# Patient Record
Sex: Female | Born: 1965 | Race: White | Hispanic: No | Smoking: Current every day smoker
Health system: Southern US, Community
[De-identification: ages and names within clinical notes are randomized; demographics above are authoritative.]

## PROBLEM LIST (undated history)

## (undated) DIAGNOSIS — Z9889 Other specified postprocedural states: Secondary | ICD-10-CM

## (undated) DIAGNOSIS — F4323 Adjustment disorder with mixed anxiety and depressed mood: Secondary | ICD-10-CM

## (undated) DIAGNOSIS — Z720 Tobacco use: Secondary | ICD-10-CM

## (undated) DIAGNOSIS — J40 Bronchitis, not specified as acute or chronic: Secondary | ICD-10-CM

## (undated) DIAGNOSIS — M5481 Occipital neuralgia: Secondary | ICD-10-CM

## (undated) DIAGNOSIS — R091 Pleurisy: Secondary | ICD-10-CM

## (undated) DIAGNOSIS — R112 Nausea with vomiting, unspecified: Secondary | ICD-10-CM

## (undated) DIAGNOSIS — M542 Cervicalgia: Secondary | ICD-10-CM

## (undated) DIAGNOSIS — J120 Adenoviral pneumonia: Secondary | ICD-10-CM

## (undated) DIAGNOSIS — R51 Headache: Secondary | ICD-10-CM

## (undated) HISTORY — DX: Tobacco use: Z72.0

## (undated) HISTORY — DX: Cervicalgia: M54.2

## (undated) HISTORY — PX: GANGLION CYST EXCISION: SHX1691

## (undated) HISTORY — DX: Adenoviral pneumonia: J12.0

## (undated) HISTORY — DX: Headache: R51

## (undated) HISTORY — PX: FRACTURE SURGERY: SHX138

## (undated) HISTORY — DX: Bronchitis, not specified as acute or chronic: J40

## (undated) HISTORY — DX: Pleurisy: R09.1

## (undated) HISTORY — PX: HEMORRHOID SURGERY: SHX153

---

## 2001-04-29 ENCOUNTER — Other Ambulatory Visit: Admission: RE | Admit: 2001-04-29 | Discharge: 2001-04-29 | Payer: Self-pay | Admitting: Obstetrics and Gynecology

## 2001-06-23 ENCOUNTER — Ambulatory Visit (HOSPITAL_COMMUNITY): Admission: RE | Admit: 2001-06-23 | Discharge: 2001-06-23 | Payer: Self-pay | Admitting: Obstetrics and Gynecology

## 2001-06-23 ENCOUNTER — Encounter: Payer: Self-pay | Admitting: Obstetrics and Gynecology

## 2002-07-18 ENCOUNTER — Ambulatory Visit (HOSPITAL_COMMUNITY): Admission: RE | Admit: 2002-07-18 | Discharge: 2002-07-18 | Payer: Self-pay | Admitting: Obstetrics and Gynecology

## 2002-07-18 ENCOUNTER — Encounter: Payer: Self-pay | Admitting: Obstetrics and Gynecology

## 2002-07-25 ENCOUNTER — Ambulatory Visit (HOSPITAL_COMMUNITY): Admission: RE | Admit: 2002-07-25 | Discharge: 2002-07-25 | Payer: Self-pay | Admitting: Obstetrics and Gynecology

## 2002-07-25 ENCOUNTER — Encounter: Payer: Self-pay | Admitting: Obstetrics and Gynecology

## 2002-10-11 ENCOUNTER — Encounter: Payer: Self-pay | Admitting: Family Medicine

## 2002-10-11 ENCOUNTER — Ambulatory Visit (HOSPITAL_COMMUNITY): Admission: RE | Admit: 2002-10-11 | Discharge: 2002-10-11 | Payer: Self-pay | Admitting: Family Medicine

## 2002-10-12 ENCOUNTER — Encounter: Payer: Self-pay | Admitting: Family Medicine

## 2002-10-12 ENCOUNTER — Ambulatory Visit (HOSPITAL_COMMUNITY): Admission: RE | Admit: 2002-10-12 | Discharge: 2002-10-12 | Payer: Self-pay | Admitting: Family Medicine

## 2003-08-11 ENCOUNTER — Ambulatory Visit (HOSPITAL_COMMUNITY): Admission: RE | Admit: 2003-08-11 | Discharge: 2003-08-11 | Payer: Self-pay | Admitting: General Surgery

## 2003-12-28 ENCOUNTER — Ambulatory Visit (HOSPITAL_COMMUNITY): Admission: RE | Admit: 2003-12-28 | Discharge: 2003-12-28 | Payer: Self-pay | Admitting: Obstetrics and Gynecology

## 2005-01-17 ENCOUNTER — Ambulatory Visit (HOSPITAL_COMMUNITY): Admission: RE | Admit: 2005-01-17 | Discharge: 2005-01-17 | Payer: Self-pay | Admitting: Obstetrics and Gynecology

## 2006-02-06 ENCOUNTER — Ambulatory Visit (HOSPITAL_COMMUNITY): Admission: RE | Admit: 2006-02-06 | Discharge: 2006-02-06 | Payer: Self-pay | Admitting: *Deleted

## 2006-07-17 LAB — CONVERTED CEMR LAB: Pap Smear: NORMAL

## 2006-09-18 ENCOUNTER — Encounter (INDEPENDENT_AMBULATORY_CARE_PROVIDER_SITE_OTHER): Payer: Self-pay | Admitting: *Deleted

## 2006-09-18 ENCOUNTER — Ambulatory Visit (HOSPITAL_COMMUNITY): Admission: RE | Admit: 2006-09-18 | Discharge: 2006-09-18 | Payer: Self-pay | Admitting: General Surgery

## 2007-02-08 ENCOUNTER — Ambulatory Visit (HOSPITAL_COMMUNITY): Admission: RE | Admit: 2007-02-08 | Discharge: 2007-02-08 | Payer: Self-pay | Admitting: Nurse Practitioner

## 2007-04-28 LAB — CONVERTED CEMR LAB
ALT: 16 units/L
Basophils Absolute: 0.1 10*3/uL
Calcium: 9.4 mg/dL
Creatinine, Ser: 0.76 mg/dL
Eosinophils Absolute: 0.3 10*3/uL
HCT: 42.5 %
Lymphocytes Relative: 27 %
Lymphs Abs: 2.4 10*3/uL
MCV: 93.5 fL
Monocytes Relative: 7 %
Neutro Abs: 5.6 10*3/uL
Neutrophils Relative %: 62 %
RBC: 4.55 M/uL
RDW: 13.5 %

## 2007-04-30 ENCOUNTER — Ambulatory Visit (HOSPITAL_COMMUNITY): Admission: RE | Admit: 2007-04-30 | Discharge: 2007-04-30 | Payer: Self-pay | Admitting: Obstetrics & Gynecology

## 2007-04-30 ENCOUNTER — Encounter: Payer: Self-pay | Admitting: Obstetrics & Gynecology

## 2007-07-14 ENCOUNTER — Ambulatory Visit: Payer: Self-pay | Admitting: Internal Medicine

## 2007-07-14 DIAGNOSIS — K069 Disorder of gingiva and edentulous alveolar ridge, unspecified: Secondary | ICD-10-CM

## 2007-07-14 DIAGNOSIS — F17200 Nicotine dependence, unspecified, uncomplicated: Secondary | ICD-10-CM

## 2007-07-14 DIAGNOSIS — K056 Periodontal disease, unspecified: Secondary | ICD-10-CM | POA: Insufficient documentation

## 2007-07-15 ENCOUNTER — Encounter (INDEPENDENT_AMBULATORY_CARE_PROVIDER_SITE_OTHER): Payer: Self-pay | Admitting: Internal Medicine

## 2007-07-15 ENCOUNTER — Telehealth (INDEPENDENT_AMBULATORY_CARE_PROVIDER_SITE_OTHER): Payer: Self-pay | Admitting: *Deleted

## 2007-07-15 LAB — CONVERTED CEMR LAB
Cholesterol: 212 mg/dL — ABNORMAL HIGH (ref 0–200)
HDL: 49 mg/dL (ref >39)
Triglycerides: 128 mg/dL (ref <150)
VLDL: 26 mg/dL (ref 0–40)

## 2007-08-13 ENCOUNTER — Other Ambulatory Visit: Admission: RE | Admit: 2007-08-13 | Discharge: 2007-08-13 | Payer: Self-pay | Admitting: Obstetrics & Gynecology

## 2007-09-26 ENCOUNTER — Encounter: Payer: Self-pay | Admitting: Emergency Medicine

## 2007-09-26 ENCOUNTER — Inpatient Hospital Stay (HOSPITAL_COMMUNITY): Admission: AD | Admit: 2007-09-26 | Discharge: 2007-09-26 | Payer: Self-pay | Admitting: Family Medicine

## 2007-09-26 ENCOUNTER — Ambulatory Visit: Payer: Self-pay | Admitting: Family Medicine

## 2007-10-15 ENCOUNTER — Ambulatory Visit: Payer: Self-pay | Admitting: Internal Medicine

## 2007-10-15 DIAGNOSIS — J069 Acute upper respiratory infection, unspecified: Secondary | ICD-10-CM | POA: Insufficient documentation

## 2008-02-11 ENCOUNTER — Ambulatory Visit (HOSPITAL_COMMUNITY): Admission: RE | Admit: 2008-02-11 | Discharge: 2008-02-11 | Payer: Self-pay | Admitting: Obstetrics & Gynecology

## 2008-02-28 ENCOUNTER — Ambulatory Visit: Payer: Self-pay | Admitting: Family Medicine

## 2008-02-28 ENCOUNTER — Inpatient Hospital Stay (HOSPITAL_COMMUNITY): Admission: AD | Admit: 2008-02-28 | Discharge: 2008-02-29 | Payer: Self-pay | Admitting: Gynecology

## 2008-08-14 ENCOUNTER — Other Ambulatory Visit: Admission: RE | Admit: 2008-08-14 | Discharge: 2008-08-14 | Payer: Self-pay | Admitting: Obstetrics & Gynecology

## 2008-11-22 ENCOUNTER — Ambulatory Visit: Payer: Self-pay | Admitting: Internal Medicine

## 2008-11-27 ENCOUNTER — Telehealth (INDEPENDENT_AMBULATORY_CARE_PROVIDER_SITE_OTHER): Payer: Self-pay | Admitting: Internal Medicine

## 2009-02-13 ENCOUNTER — Ambulatory Visit (HOSPITAL_COMMUNITY): Admission: RE | Admit: 2009-02-13 | Discharge: 2009-02-13 | Payer: Self-pay | Admitting: Obstetrics & Gynecology

## 2009-08-30 ENCOUNTER — Other Ambulatory Visit: Admission: RE | Admit: 2009-08-30 | Discharge: 2009-08-30 | Payer: Self-pay | Admitting: Obstetrics and Gynecology

## 2009-12-16 ENCOUNTER — Emergency Department (HOSPITAL_COMMUNITY): Admission: EM | Admit: 2009-12-16 | Discharge: 2009-12-16 | Payer: Self-pay | Admitting: Emergency Medicine

## 2009-12-16 ENCOUNTER — Ambulatory Visit (HOSPITAL_COMMUNITY): Admission: EM | Admit: 2009-12-16 | Discharge: 2009-12-16 | Payer: Self-pay | Admitting: Emergency Medicine

## 2010-03-21 ENCOUNTER — Ambulatory Visit (HOSPITAL_COMMUNITY): Admission: RE | Admit: 2010-03-21 | Discharge: 2010-03-21 | Payer: Self-pay | Admitting: Obstetrics & Gynecology

## 2010-10-29 NOTE — Op Note (Signed)
[name], [name]                 ACCOUNT NO.:  [id number]   MEDICAL RECORD NO.:  [id number]          PATIENT TYPE:  AMB   LOCATION:  DAY                           FACILITY:  [facility]   PHYSICIAN:  Lazaro Arms, M.D.   DATE OF BIRTH:  [birth year]   DATE OF PROCEDURE:  04/30/2007  DATE OF DISCHARGE:                               OPERATIVE REPORT   PREOPERATIVE DIAGNOSES:  1. Menometrorrhagia.  2. Dysmenorrhea.   POSTOPERATIVE DIAGNOSES:  1. Menometrorrhagia.  2. Dysmenorrhea.   PROCEDURE:  Hysteroscopy, D&C, endometrial ablation.   SURGEON:  Dr. Despina Hidden   ANESTHESIA:  General endotracheal.   FINDINGS:  The patient had normal endometrial cavity, no polyps,  fibroids, or other abnormalities.   DESCRIPTION OF OPERATION:  The patient was taken to the operating room,  placed in a supine position, where she underwent general endotracheal  anesthesia.  She was then placed in dorsal lithotomy position, prepped  and draped in the usual sterile fashion.  A Grave speculum was placed.  Her cervix was grasped.  The cervix was dilated serially to allow  passage of the hysteroscope.  A diagnostic hysteroscopy was performed  and found to be completely normal.  A vigorous uterine curettage was  then performed.  Good uterine cry was obtained in all areas.  A  ThermaChoice 3 endometrial ablation balloon was used, and a total  therapy time of 10 minutes and 3 seconds to a temperature of 88 degrees  Celsius that required 11 mL of D5W.  All the fluid was returned at the  end of the procedure.  The patient tolerated the procedure well.  She  experienced minimal blood loss, taken to the recovery room in good  stable condition.  All counts correct.      Lazaro Arms, M.D.  Electronically Signed     LHE/MEDQ  D:  04/30/2007  T:  05/01/2007  Job:  [id number]

## 2010-11-01 NOTE — Op Note (Signed)
[name], [name]                 ACCOUNT NO.:  [id number]   MEDICAL RECORD NO.:  [id number]          PATIENT TYPE:  AMB   LOCATION:  DAY                           FACILITY:  [facility]   PHYSICIAN:  Dalia Heading, M.D.  DATE OF BIRTH:  08-08-1965   DATE OF PROCEDURE:  09/18/2006  DATE OF DISCHARGE:                               OPERATIVE REPORT   PREOPERATIVE DIAGNOSIS:  Recurrent ganglion cyst, left wrist.   POSTOPERATIVE DIAGNOSIS:  Recurrent ganglion cyst, left wrist.   PROCEDURE:  Excision of recurrent ganglion cyst, left wrist.   SURGEON:  Franky Macho, MD   ANESTHESIA:  Regional.   INDICATIONS:  The patient is a 45 year old of white female, status post  excision of a ganglion cyst in the past, who now presents with a  recurrence of the ganglion cyst in the left wrist.  The risks and  benefits of the procedure including bleeding, infection, and recurrence  of the cyst were fully explained to the patient, who gave informed  consent.   PROCEDURE NOTE:  The patient was placed in the supine position.  After a  regional block was applied, the left wrist was prepped and draped using  the usual sterile technique with Betadine.  Surgical site confirmation  was performed.   The ganglion cyst was along the volar aspect of the wrist, just over the  radial artery.  An incision was made longitudinally along the previous  surgical scar.  The cyst was excised without difficulty.  The base of  the cyst was ligated using a 5-0 nylon suture.  The skin was  reapproximated using a 4-0 nylon vertical mattress suture.  Betadine  ointment and dry sterile dressing were applied.   All tape and needle counts were correct at the end of the procedure.  The tourniquet was let down at approximately 22 minutes.  The patient  was transferred to PACU in stable condition.   COMPLICATIONS:  None.   SPECIMEN:  Ganglion cyst, left wrist.   BLOOD LOSS:  Minimal.      Dalia Heading, M.D.  Electronically Signed     MAJ/MEDQ  D:  09/18/2006  T:  09/19/2006  Job:  [id number]   cc:   [facility], [place], [place] Ninfa Linden FNP

## 2010-11-01 NOTE — Op Note (Signed)
NAMESHAMIR, SEDLAR                           ACCOUNT NO.:  [id number]   MEDICAL RECORD NO.:  [id number]                   PATIENT TYPE:  AMB   LOCATION:  DAY                                  FACILITY:  [facility]   PHYSICIAN:  Dalia Heading, M.D.               DATE OF BIRTH:  05-15-1966   DATE OF PROCEDURE:  08/11/2003  DATE OF DISCHARGE:                                 OPERATIVE REPORT   PREOPERATIVE DIAGNOSIS:  Hemorrhoidal disease.   POSTOPERATIVE DIAGNOSIS:  Hemorrhoidal disease.   PROCEDURE:  Simple hemorrhoidectomy.   SURGEON:  Dalia Heading, M.D.   ANESTHESIA:  General.   INDICATIONS:  The patient is a 45 year old white female who presents for  evaluation and treatment of hemorrhoidal disease.  She has protrusion along  the posterior aspect of the anus.  The risks and benefits of the procedure  including bleeding, infection, and recurrence of the hemorrhoidal disease  were fully explained to the patient, who gave informed consent   DESCRIPTION OF PROCEDURE:  The patient was placed in the lithotomy position  after general anesthesia was administered.  The perineum was prepped and  draped using the usual sterile technique with Betadine.  Surgical site  confirmation was performed.   On rectal examination, an internal hemorrhoid and external hemorrhoid were  noted at the 7 o'clock position.  There was also an external tag at the 5  o'clock position.  Both hemorrhoids were excised without difficulty.  The  mucosal edges were reapproximated using a 2-0 Vicryl running suture.  Any  bleeding was controlled using Bovie electrocautery.  Sensorcaine 0.5% was  instilled into the surrounding perineum.  No other significant disease was  noted.  Surgicel and viscus Xylocaine packing was then placed into the  rectum.   All tape and needle counts were correct at the end of the procedure.  The  patient was awakened and transferred to PACU in stable condition.   COMPLICATIONS:   None.   SPECIMEN:  Hemorrhoidal tissue.   BLOOD LOSS:  Minimal.      ___________________________________________                                            Dalia Heading, M.D.   MAJ/MEDQ  D:  08/11/2003  T:  08/11/2003  Job:  [id number]   cc:   Dalia Heading, M.D.  [address] [place]  [place] [address]  Fax: [phone]   W. Simone Curia, M.D.  [address]  [place]  [place] [address]  Fax: [phone]

## 2010-11-01 NOTE — H&P (Signed)
NAME:  [name], [name]                           ACCOUNT NO.:  [id number]   MEDICAL RECORD NO.:  [id number]                   PATIENT TYPE:  AMB   LOCATION:  DAY                                  FACILITY:  [facility]   PHYSICIAN:  Dalia Heading, M.D.               DATE OF BIRTH:  [birth year]   DATE OF ADMISSION:  DATE OF DISCHARGE:                                HISTORY & PHYSICAL   CHIEF COMPLAINT:  Hemorrhoidal disease.   HISTORY OF PRESENT ILLNESS:  The patient is a 45 year old white female who  is referred for evaluation and treatment of hemorrhoidal disease.  She has  had problems with painful protruding hemorrhoids for many years.  Her last  episode was last week, treated with topical ointments, and she would like to  have a hemorrhoidectomy.   PAST MEDICAL HISTORY:  Unremarkable.   PAST SURGICAL HISTORY:  Unremarkable.   CURRENT MEDICATIONS:  None.   ALLERGIES:  No known drug allergies.   REVIEW OF SYSTEMS:  The patient smokes a pack of cigarettes a day.  She  denies alcohol use.  She denies any other cardiopulmonary difficulties or  bleeding disorders.   PHYSICAL EXAMINATION:  GENERAL:  The patient is a well-developed, well-  nourished white female in no acute distress.  VITAL SIGNS:  She is afebrile and vital signs are stable.  LUNGS:  Clear to auscultation with equal breath sounds bilaterally.  HEART:  Reveals a regular rate and rhythm without S3, S4, or murmurs.  RECTAL:  Reveals irritated hemorrhoids at the 4 and 7 o'clock positions.  Examination is limited secondary to discomfort.   IMPRESSION:  Hemorrhoidal disease.   PLAN:  The patient is scheduled for a hemorrhoidectomy on August 11, 2003.  The risks and benefits of the procedure including bleeding, infection, and  recurrence of the hemorrhoids was fully explained to the patient, who gave  informed consent.     ___________________________________________                                         Dalia Heading, M.D.   MAJ/MEDQ  D:  08/03/2003  T:  08/03/2003  Job:  [id number]   cc:   Donna Bernard, M.D.  [address]  [place]  [place] [address]  Fax: [phone]

## 2010-11-01 NOTE — H&P (Signed)
NAMEKHRYSTAL, [name]                 ACCOUNT NO.:  [id number]   MEDICAL RECORD NO.:  [id number]          PATIENT TYPE:  AMB   LOCATION:  DAY                           FACILITY:  [facility]   PHYSICIAN:  Dalia Heading, M.D.  DATE OF BIRTH:  [birth year]   DATE OF ADMISSION:  DATE OF DISCHARGE:  LH                              HISTORY & PHYSICAL   CHIEF COMPLAINT:  Recurrent ganglion cyst of the left wrist.   HISTORY OF PRESENT ILLNESS:  This patient is a 45 year old, white female  who is referred for evaluation and treatment of a recurrent left wrist  ganglion cyst.  She had had surgical excision by another surgeon but it  returned soon after the surgery.   PAST MEDICAL HISTORY:  Unremarkable.   PAST SURGICAL HISTORY:  As noted above, hemorrhoidectomy.   CURRENT MEDICATIONS:  None.   ALLERGIES:  No known drug allergies.   REVIEW OF SYSTEMS:  The patient does not drink or smoke.  She denies any  cardiopulmonary difficulties or bleeding disorders.   PHYSICAL EXAMINATION:  GENERAL:  The patient is a well-developed, well-  nourished, white female in no acute distress.  LUNGS:  Clear to auscultation with equal breath sounds bilaterally.  HEART:  Regular rate and rhythm without S3, S4, murmurs.  LEFT WRIST:  Ganglion cyst over the radial artery on the left.  The  ulnar artery is palpable.   IMPRESSION:  Recurrent ganglion cyst, left wrist.   PLAN:  The patient is scheduled for a recurrent excision of the ganglion  cyst of the left wrist on September 18, 2006.  The risks and benefits of the  procedure including bleeding, infection, and the possibility of  recurrence of this cyst were fully explained to the patient, gave  informed consent.      Dalia Heading, M.D.  Electronically Signed     MAJ/MEDQ  D:  09/17/2006  T:  09/17/2006  Job:  [id number]   cc:   Ninfa Linden, FNP  [facility]  [address]  [place], [place] [address]

## 2011-02-11 ENCOUNTER — Other Ambulatory Visit (HOSPITAL_COMMUNITY): Payer: Self-pay | Admitting: Nurse Practitioner

## 2011-02-11 DIAGNOSIS — Z139 Encounter for screening, unspecified: Secondary | ICD-10-CM

## 2011-03-11 LAB — BASIC METABOLIC PANEL
CO2: 27
Calcium: 8.8
Creatinine, Ser: 0.67
Potassium: 3.6

## 2011-03-11 LAB — CBC
HCT: 36.4
Hemoglobin: 13
MCHC: 35.7
MCV: 95.4
WBC: 13.4 — ABNORMAL HIGH

## 2011-03-11 LAB — DIFFERENTIAL
Basophils Absolute: 0
Basophils Relative: 0
Eosinophils Absolute: 0.2
Lymphocytes Relative: 21
Monocytes Absolute: 1.3 — ABNORMAL HIGH
Monocytes Relative: 10
Neutro Abs: 9.1 — ABNORMAL HIGH

## 2011-03-11 LAB — WET PREP, GENITAL: Yeast Wet Prep HPF POC: NONE SEEN

## 2011-03-17 LAB — CBC
MCV: 96.7
Platelets: 244
WBC: 10.3

## 2011-03-17 LAB — DIFFERENTIAL
Basophils Absolute: 0
Eosinophils Absolute: 0.3
Eosinophils Relative: 3
Lymphocytes Relative: 21
Lymphs Abs: 2.2
Monocytes Absolute: 0.7
Monocytes Relative: 7
Neutrophils Relative %: 69

## 2011-03-19 LAB — COMPREHENSIVE METABOLIC PANEL
ALT: 11
AST: 30
Albumin: 3.6
Alkaline Phosphatase: 69
Chloride: 108
Creatinine, Ser: 0.6
GFR calc Af Amer: >60
Potassium: 5.8 — ABNORMAL HIGH
Sodium: 134 — ABNORMAL LOW
Total Bilirubin: 1.4 — ABNORMAL HIGH

## 2011-03-19 LAB — DIFFERENTIAL
Basophils Absolute: 0
Lymphocytes Relative: 8 — ABNORMAL LOW
Lymphs Abs: 1.3
Neutro Abs: 14.8 — ABNORMAL HIGH
Neutrophils Relative %: 90 — ABNORMAL HIGH

## 2011-03-19 LAB — CBC
Platelets: 259
RDW: 13.6
WBC: 16.5 — ABNORMAL HIGH

## 2011-03-19 LAB — BASIC METABOLIC PANEL
BUN: 7
Calcium: 8.9
GFR calc non Af Amer: >60
Glucose, Bld: 99

## 2011-03-19 LAB — URINALYSIS, ROUTINE W REFLEX MICROSCOPIC
Glucose, UA: NEGATIVE
Ketones, ur: 15 — AB
Leukocytes, UA: NEGATIVE
Nitrite: NEGATIVE
Protein, ur: NEGATIVE
Specific Gravity, Urine: 1.02
Urobilinogen, UA: 0.2

## 2011-03-19 LAB — GC/CHLAMYDIA PROBE AMP, GENITAL
Chlamydia, DNA Probe: NEGATIVE
GC Probe Amp, Genital: NEGATIVE

## 2011-03-19 LAB — WET PREP, GENITAL
Trich, Wet Prep: NONE SEEN
Yeast Wet Prep HPF POC: NONE SEEN

## 2011-03-24 ENCOUNTER — Ambulatory Visit (HOSPITAL_COMMUNITY): Payer: Self-pay

## 2011-03-25 LAB — URINALYSIS, ROUTINE W REFLEX MICROSCOPIC
Glucose, UA: NEGATIVE
pH: 5.5

## 2011-03-25 LAB — COMPREHENSIVE METABOLIC PANEL
ALT: 16
Calcium: 9.4
Glucose, Bld: 92
Sodium: 137
Total Protein: 6.7

## 2011-03-25 LAB — DIFFERENTIAL
Lymphs Abs: 2.4
Monocytes Relative: 7
Neutro Abs: 5.6
Neutrophils Relative %: 62

## 2011-03-25 LAB — CBC
Hemoglobin: 14.6
MCHC: 34.3
Platelets: 322
RDW: 13.5

## 2011-04-07 ENCOUNTER — Ambulatory Visit (HOSPITAL_COMMUNITY)
Admission: RE | Admit: 2011-04-07 | Discharge: 2011-04-07 | Disposition: A | Discharge status: Home / Self Care | Payer: Managed Care, Other (non HMO) | Source: Ambulatory Visit | Attending: Nurse Practitioner | Admitting: Nurse Practitioner

## 2011-04-07 DIAGNOSIS — Z139 Encounter for screening, unspecified: Secondary | ICD-10-CM

## 2011-04-07 DIAGNOSIS — Z1231 Encounter for screening mammogram for malignant neoplasm of breast: Secondary | ICD-10-CM | POA: Insufficient documentation

## 2012-03-16 ENCOUNTER — Other Ambulatory Visit (HOSPITAL_COMMUNITY): Payer: Self-pay | Admitting: Nurse Practitioner

## 2012-03-16 DIAGNOSIS — Z139 Encounter for screening, unspecified: Secondary | ICD-10-CM

## 2012-04-12 ENCOUNTER — Ambulatory Visit (HOSPITAL_COMMUNITY)
Admission: RE | Admit: 2012-04-12 | Discharge: 2012-04-12 | Disposition: A | Discharge status: Home / Self Care | Payer: Managed Care, Other (non HMO) | Source: Ambulatory Visit | Attending: Nurse Practitioner | Admitting: Nurse Practitioner

## 2012-04-12 DIAGNOSIS — Z139 Encounter for screening, unspecified: Secondary | ICD-10-CM

## 2012-04-12 DIAGNOSIS — Z1231 Encounter for screening mammogram for malignant neoplasm of breast: Secondary | ICD-10-CM | POA: Insufficient documentation

## 2012-07-13 ENCOUNTER — Other Ambulatory Visit (HOSPITAL_COMMUNITY): Payer: Self-pay | Admitting: Internal Medicine

## 2012-07-13 ENCOUNTER — Ambulatory Visit (HOSPITAL_COMMUNITY)
Admission: RE | Admit: 2012-07-13 | Discharge: 2012-07-13 | Disposition: A | Discharge status: Home / Self Care | Payer: Managed Care, Other (non HMO) | Source: Ambulatory Visit | Attending: Internal Medicine | Admitting: Internal Medicine

## 2012-07-13 DIAGNOSIS — R059 Cough, unspecified: Secondary | ICD-10-CM | POA: Insufficient documentation

## 2012-07-13 DIAGNOSIS — R0602 Shortness of breath: Secondary | ICD-10-CM

## 2012-07-13 DIAGNOSIS — R05 Cough: Secondary | ICD-10-CM | POA: Insufficient documentation

## 2012-10-13 ENCOUNTER — Telehealth: Payer: Self-pay

## 2012-10-13 NOTE — Telephone Encounter (Signed)
Pt states she is having severe cervical pain, requesting injection.

## 2012-10-14 ENCOUNTER — Encounter: Payer: Self-pay | Admitting: Neurology

## 2012-10-14 ENCOUNTER — Ambulatory Visit (INDEPENDENT_AMBULATORY_CARE_PROVIDER_SITE_OTHER): Payer: Managed Care, Other (non HMO) | Admitting: Neurology

## 2012-10-14 DIAGNOSIS — M531 Cervicobrachial syndrome: Secondary | ICD-10-CM

## 2012-10-14 DIAGNOSIS — R091 Pleurisy: Secondary | ICD-10-CM

## 2012-10-14 DIAGNOSIS — M5481 Occipital neuralgia: Secondary | ICD-10-CM

## 2012-10-14 DIAGNOSIS — M542 Cervicalgia: Secondary | ICD-10-CM

## 2012-10-14 MED ORDER — BETAMETHASONE SOD PHOS & ACET 6 (3-3) MG/ML IJ SUSP: 12.0000 mg | Freq: Once | INTRAMUSCULAR | Status: AC

## 2012-10-14 MED ORDER — BUPIVACAINE HCL (PF) 0.5 % IJ SOLN: 10.0000 mL | Freq: Once | INTRAMUSCULAR | Status: DC

## 2012-10-14 MED ORDER — PREDNISONE 5 MG PO TABS: 5.0000 mg | ORAL_TABLET | Freq: Two times a day (BID) | ORAL | Status: DC

## 2012-10-14 NOTE — Progress Notes (Signed)
[facility]  Provider:  Dr Deondra Wigger Referring Provider: Jeni Salles, FNP Primary Care Physician:  Ninfa Linden, FNP  Chief Complaint  Patient presents with  . Follow-up    headaches, [name], trigger-point,rm 11    HPI:  [name] is a 47 y.o. female here as a referral from Dr. Margo Aye in [place]. The patient was previously last seen for occipital neuralgia on 08/02/2011 she received Celestone and Marcaine and was 14 months pain-free. She returned today after 3 weeks of the occurring neurologic pain study culture and occipital neuralgia. She has at times reached a pain level that sometimes causes nausea or vision problems. Occipital pain radiates upwards to the trauma and down to the cervical paraspinal area she does not have shoulder pain. The patient informed me that she has meanwhile had multiple bouts of bronchitis, pneumonia and pleural involvement. She is still a daily smoker, she has changed to vapor cigarettes. The patient's husband works for a tobacco company, she has not gained weight , but has discussed with her primary care physician possible weight loss medications. She is somewhat limited in her ability to exercise from the recent infections and shortness of breath.  Review of Systems: Out of a complete 14 system review, the patient complains of only the following symptoms, and all other reviewed systems are negative. Dry mouth, productive cough, neck pain and occipital neuralgia, copd- obstructive,   History   Social History  . Marital Status: Married    Spouse Name: N/A    Number of Children: 0  . Years of Education: N/A   Occupational History  . elected official, [employer]    Social History Main Topics  . Smoking status: Current Every Day Smoker  . Smokeless tobacco: Not on file  . Alcohol Use: No  . Drug Use: Not on file  . Sexually Active: Not on file   Other Topics Concern  . Not on file   Social History Narrative  .  No narrative on file    Family History  Problem Relation Age of Onset  . Hypertension Brother     Past Medical History  Diagnosis Date  . Headache   . Cervicalgia   . Bronchitis due to tobacco use   . Pleuritis   . Pneumonia due to adenovirus     Past Surgical History  Procedure Laterality Date  . Ganglion cyst excision      left hand wrist    Current Outpatient Prescriptions  Medication Sig Dispense Refill  . ibuprofen (ADVIL,MOTRIN) 600 MG tablet Take 600 mg by mouth every 6 (six) hours as needed for pain. As needed      . lidocaine (XYLOCAINE) 2 % jelly Apply topically as needed. Injection -triggerpoint      . methylPREDNISolone acetate (DEPO-MEDROL) 20 MG/ML SUSP once.      . ALPRAZolam (XANAX) 0.25 MG tablet Take 0.25 mg by mouth at bedtime as needed for sleep.      Marland Kitchen amitriptyline (ELAVIL) 25 MG tablet Take 25 mg by mouth at bedtime. Take 25mg  for 4 days, if tolerated increase to 50mg        No current facility-administered medications for this visit.    Allergies as of 10/14/2012  . (No Known Allergies)    Vitals: BP 139/90  Pulse 84  Resp 15  Ht 5' 3.5" (1.613 m)  Wt 184 lb (83.462 kg)  BMI 32.08 kg/m2 Last Weight:  Wt Readings from Last 1 Encounters:  10/14/12 184 lb (83.462  kg)   Last Height:   Ht Readings from Last 1 Encounters:  10/14/12 5' 3.5" (1.613 m)   Vision Screening:   Physical exam:  General: The patient is awake, alert and appears not in acute distress. The patient is well groomed. Head: Normocephalic, atraumatic.  Neck is supple.  Cardiovascular:  Regular rate and rhythm, without  murmurs or carotid bruit, and without distended neck veins. Respiratory: Lungs are clear to auscultation. Skin:  Without evidence of edema, or rash Trunk: BMI is elevated and patient  has normal posture.  Neurologic exam : The patient is awake and alert, oriented to place and time.  Memory subjective  described as intact. There is a normal attention  span & concentration ability. Speech is fluent without  dysarthria, dysphonia or aphasia. Mood and affect are appropriate.  Cranial nerves: Pupils are equal and briskly reactive to light. Funduscopic exam without  evidence of pallor or edema. Extraocular movements  in vertical and horizontal planes intact and without nystagmus. Visual fields by finger perimetry are intact. Hearing to finger rub intact.  Facial sensation intact to fine touch. Facial motor strength is symmetric and tongue and uvula move midline.  Motor exam:   Normal tone and normal muscle bulk and symmetric normal strength in all extremities.  Sensory:  Fine touch, pinprick and vibration were tested in all extremities. Proprioception is tested in the upper extremities only. This was  normal.  Coordination: Rapid alternating movements in the fingers/hands is tested and normal. Finger-to-nose maneuver tested and normal without evidence of ataxia, dysmetria or tremor.  Gait and station: Patient walks without assistive device and is able and assisted stool climb up to the exam table. Strength within normal limits.  Deep tendon reflexes: in the  upper and lower extremities are symmetric and intact. Babinski maneuver response is  downgoing.   Assessment: Patient received trigger point injections. Plan:  Treatment plan and additional workup will be reviewed under Problem List.

## 2012-10-14 NOTE — Patient Instructions (Signed)
Occipital Neuralgia Neuralgias are attacks of sharp stabbing pain. They may be intermittent (comes and goes) or constant in nature. They may be brief attacks that last seconds to minutes and may come back for days to weeks. The neuralgias can occur as a result of a herpes zoster (shingles), chickenpox infection, or even following a herpes simplex infection (cold sore). TYPES OF NEURALGIA  When these pains are located in the back of the head and neck they are called occipital neuralgias.  When the pain is located between ribs it is called intercostal neuralgia.  When the pain is located in the face it is called trigeminal neuralgia. This is the most common neuralgia. It causes sharp, shock like pain on one side of your face. The neuralgias, which follow herpes zoster infections, often produce a constant burning pain. They may last from weeks to months and even years. The attacks of pain may come from injury or inflammation (irritation) to a nerve. Often the cause is unknown. The episodes of pain may be caused by light touch, movement, or even eating and sneezing. Usually these neuralgias occur after age forty. The neuralgias following shingles and trigeminal neuralgia are the most common. Although painful, these episodes do not threaten life and tend to lessen as we grow older. TREATMENT  There are many medications that may be helpful in the treatment of this disorder. Sometimes several medications may have to be tried before the right combination can be found for you. Some of these medications are:  Only take over-the-counter or prescription medications for pain, discomfort, or fever as directed by your caregiver.  Narcotic medications may be used to control the pain.  Antidepressants and medications used in epilepsy (seizure disorders) may be useful. LET YOUR CAREGIVER KNOW ABOUT:  If you do not obtain relief from medications.  Problems that are getting worse rather than better.  Troubling  side effects that you think are coming from the medication. Do not be discouraged if you do not obtain instant relief from the medications or help given you. Your caregiver can help you get through these episodes of pain with some persistence (continued trying) on your part also. Document Released: 05/27/2001 Document Revised: 08/25/2011 Document Reviewed: 06/02/2005 [employer] Patient Information 2013 [employer].  

## 2012-10-15 ENCOUNTER — Ambulatory Visit: Payer: Self-pay | Admitting: Neurology

## 2012-12-06 ENCOUNTER — Encounter (HOSPITAL_COMMUNITY): Payer: Self-pay | Admitting: *Deleted

## 2012-12-06 ENCOUNTER — Emergency Department (HOSPITAL_COMMUNITY)
Admission: EM | Admit: 2012-12-06 | Discharge: 2012-12-07 | Disposition: A | Discharge status: Home / Self Care | Payer: Managed Care, Other (non HMO) | Attending: Emergency Medicine | Admitting: Emergency Medicine

## 2012-12-06 DIAGNOSIS — R45851 Suicidal ideations: Secondary | ICD-10-CM | POA: Insufficient documentation

## 2012-12-06 DIAGNOSIS — Z3202 Encounter for pregnancy test, result negative: Secondary | ICD-10-CM | POA: Insufficient documentation

## 2012-12-06 DIAGNOSIS — F17200 Nicotine dependence, unspecified, uncomplicated: Secondary | ICD-10-CM | POA: Insufficient documentation

## 2012-12-06 DIAGNOSIS — F4322 Adjustment disorder with anxiety: Secondary | ICD-10-CM | POA: Insufficient documentation

## 2012-12-06 DIAGNOSIS — IMO0002 Reserved for concepts with insufficient information to code with codable children: Secondary | ICD-10-CM | POA: Insufficient documentation

## 2012-12-06 DIAGNOSIS — Z8701 Personal history of pneumonia (recurrent): Secondary | ICD-10-CM | POA: Insufficient documentation

## 2012-12-06 DIAGNOSIS — Z8709 Personal history of other diseases of the respiratory system: Secondary | ICD-10-CM | POA: Insufficient documentation

## 2012-12-06 LAB — URINALYSIS, ROUTINE W REFLEX MICROSCOPIC
Bilirubin Urine: NEGATIVE
Nitrite: NEGATIVE
Specific Gravity, Urine: 1.01 (ref 1.005–1.030)
pH: 6 (ref 5.0–8.0)

## 2012-12-06 LAB — CBC WITH DIFFERENTIAL/PLATELET
Eosinophils Relative: 0 % (ref 0–5)
HCT: 43.8 % (ref 36.0–46.0)
Lymphocytes Relative: 15 % (ref 12–46)
Lymphs Abs: 2.3 10*3/uL (ref 0.7–4.0)
MCV: 96.5 fL (ref 78.0–100.0)
Monocytes Absolute: 0.8 10*3/uL (ref 0.1–1.0)
Platelets: 357 10*3/uL (ref 150–400)
RBC: 4.54 MIL/uL (ref 3.87–5.11)
WBC: 15.1 10*3/uL — ABNORMAL HIGH (ref 4.0–10.5)

## 2012-12-06 LAB — ETHANOL: Alcohol, Ethyl (B): <11 mg/dL (ref 0–11)

## 2012-12-06 LAB — RAPID URINE DRUG SCREEN, HOSP PERFORMED
Barbiturates: NOT DETECTED
Benzodiazepines: NOT DETECTED
Cocaine: NOT DETECTED
Opiates: NOT DETECTED

## 2012-12-06 LAB — BASIC METABOLIC PANEL
CO2: 24 mEq/L (ref 19–32)
Chloride: 98 mEq/L (ref 96–112)
Sodium: 137 mEq/L (ref 135–145)

## 2012-12-06 LAB — URINE MICROSCOPIC-ADD ON

## 2012-12-06 NOTE — ED Notes (Signed)
Patient alert and tearful at this time. States "I got myself into some trouble. I got caught shoplifting and I got out of that and then I stole some money from my job. I know something is wrong with me because that is just not me and I never do stuff like that. My brother was killed in 2011 and I have tried to hold it in and be strong for everybody else and I just can't do it anymore." When asked about a suicidal plan, patient stated "I thought about shooting myself with a gun this past weekend."

## 2012-12-06 NOTE — ED Notes (Signed)
Pt crying, feels suicidal.  Since Friday.

## 2012-12-06 NOTE — ED Notes (Signed)
Patient placed in paper scrubs. Belongings removed and secured in locker. Patient given diet coke at patient request.

## 2012-12-06 NOTE — ED Provider Notes (Signed)
History  This chart was scribed for Gilda Crease, * by Manuela Schwartz, ED scribe. This patient was seen in room APA16A/APA16A and the patient's care was started at 2105.  CSN: [id number] Arrival date & time 12/06/12  2105  First MD Initiated Contact with Patient 12/06/12 2151     No chief complaint on file.  The history is provided by the patient. No language interpreter was used.   HPI Comments: [name] is a 47 y.o. female who presents to the Emergency Department complaining of suicidal ideation onset 3 days ago after recent legal problems. She denies any self injury but states that her husband has a gun at home that she thought about using on herself. She states that this is not normal for her and the only other time she has felt like hurting herself was when her son passed away. She denies hearing any voices. She denies any fever or recent illnesses.   Past Medical History  Diagnosis Date  . Headache(784.0)   . Cervicalgia   . Bronchitis due to tobacco use   . Pleuritis   . Pneumonia due to adenovirus    Past Surgical History  Procedure Laterality Date  . Ganglion cyst excision      left hand wrist  . Fracture surgery     Family History  Problem Relation Age of Onset  . Hypertension Brother    History  Substance Use Topics  . Smoking status: Current Every Day Smoker  . Smokeless tobacco: Not on file  . Alcohol Use: Yes   OB History   Grav Para Term Preterm Abortions TAB SAB Ect Mult Living                 Review of Systems  Constitutional: Negative for fever and chills.  Respiratory: Negative for shortness of breath.   Gastrointestinal: Negative for nausea and vomiting.  Neurological: Negative for weakness.  Psychiatric/Behavioral: Positive for suicidal ideas. Negative for hallucinations and self-injury. The patient is nervous/anxious.   All other systems reviewed and are negative.   A complete 10 system review of systems was obtained and all systems  are negative except as noted in the HPI and PMH.   Allergies  Review of patient's allergies indicates no known allergies.  Home Medications   Current Outpatient Rx  Name  Route  Sig  Dispense  Refill  . ALPRAZolam (XANAX) 0.25 MG tablet   Oral   Take 0.25 mg by mouth at bedtime as needed for sleep.         Marland Kitchen amitriptyline (ELAVIL) 25 MG tablet   Oral   Take 25 mg by mouth at bedtime. Take 25mg  for 4 days, if tolerated increase to 50mg          . ibuprofen (ADVIL,MOTRIN) 600 MG tablet   Oral   Take 600 mg by mouth every 6 (six) hours as needed for pain. As needed         . lidocaine (XYLOCAINE) 2 % jelly   Topical   Apply topically as needed. Injection -triggerpoint         . methylPREDNISolone acetate (DEPO-MEDROL) 20 MG/ML SUSP      once.         . predniSONE (DELTASONE) 5 MG tablet   Oral   Take 1 tablet (5 mg total) by mouth 2 (two) times daily.   14 tablet   1    Triage Vitals: BP 153/97  Pulse 99  Temp(Src) 98.4 F (36.9  C) (Oral)  Resp 20  Ht 5\' 4"  (1.626 m)  Wt 170 lb (77.111 kg)  BMI 29.17 kg/m2  SpO2 100% Physical Exam  Nursing note and vitals reviewed. Constitutional: She is oriented to person, place, and time. She appears well-developed and well-nourished. No distress.  HENT:  Head: Normocephalic and atraumatic.  Right Ear: Hearing normal.  Left Ear: Hearing normal.  Nose: Nose normal.  Mouth/Throat: Oropharynx is clear and moist and mucous membranes are normal.  Eyes: Conjunctivae and EOM are normal. Pupils are equal, round, and reactive to light.  Neck: Normal range of motion. Neck supple.  Cardiovascular: Regular rhythm, S1 normal and S2 normal.  Exam reveals no gallop and no friction rub.   No murmur heard. Pulmonary/Chest: Effort normal and breath sounds normal. No respiratory distress. She exhibits no tenderness.  Abdominal: Soft. Normal appearance and bowel sounds are normal. There is no hepatosplenomegaly. There is no tenderness.  There is no rebound, no guarding, no tenderness at McBurney's point and negative Murphy's sign. No hernia.  Musculoskeletal: Normal range of motion.  Neurological: She is alert and oriented to person, place, and time. She has normal strength. No cranial nerve deficit or sensory deficit. Coordination normal. GCS eye subscore is 4. GCS verbal subscore is 5. GCS motor subscore is 6.  Skin: Skin is warm, dry and intact. No rash noted. No cyanosis.  Psychiatric: Her speech is normal.  Suicidal, depressed/flat affect.  Tearful on exam    ED Course  Procedures (including critical care time) DIAGNOSTIC STUDIES: Oxygen Saturation is 100% on room air, normal by my interpretation.    COORDINATION OF CARE: At 1005 PM Discussed treatment plan with patient which includes blood work, UA, drug screen. Patient agrees.   Labs Reviewed  CBC WITH DIFFERENTIAL - Abnormal; Notable for the following:    WBC 15.1 (*)    Hemoglobin 15.7 (*)    MCH 34.6 (*)    Neutrophils Relative % 80 (*)    Neutro Abs 12.1 (*)    All other components within normal limits  URINALYSIS, ROUTINE W REFLEX MICROSCOPIC - Abnormal; Notable for the following:    Hgb urine dipstick SMALL (*)    Ketones, ur TRACE (*)    All other components within normal limits  URINE MICROSCOPIC-ADD ON - Abnormal; Notable for the following:    Squamous Epithelial / LPF FEW (*)    All other components within normal limits  URINE RAPID DRUG SCREEN (HOSP PERFORMED)  BASIC METABOLIC PANEL  ETHANOL  POCT PREGNANCY, URINE   No results found.  Diagnosis: Depression  MDM  Patient presents to the ER for evaluation of depression. Patient reports that she has recently had legal troubles that have caused her to have increased depression. Patient states that she is now feeling suicidal. She is to severely depressed and suicidal during evaluation here in the ER. She will require psychiatric evaluation. Patient medically clear for psychiatric  treatment.  I personally performed the services described in this documentation, which was scribed in my presence. The recorded information has been reviewed and is accurate.    Gilda Crease, MD 12/06/12 2248

## 2012-12-07 NOTE — ED Provider Notes (Signed)
0100 Assumed care/disposition of patient. Patient presented to the ER with suicidal thoughts and depression. She is in the process of being arrested for embezzlement and advised the jail of her suicidal thoughts. She is here to be psychiatrically evaluated. She has been medically cleared pending tele psych evaluation.  0200 Spoke with Dr. Jacky Kindle, tele psych who will evaluate the patient. 1610 Dr. Jacky Kindle has completed his interview and report. He feels she is psychiatrically cleared for out patient treatment and can be discharged at this time. Reviewed with the patient her choices for help in the community. Discharge completed.  [name]. Colon Branch, MD 12/07/12 [phone]

## 2012-12-07 NOTE — ED Notes (Signed)
Discharge instructions reviewed with pt, questions answered. Pt verbalized understanding.  

## 2012-12-09 ENCOUNTER — Ambulatory Visit (INDEPENDENT_AMBULATORY_CARE_PROVIDER_SITE_OTHER): Payer: Managed Care, Other (non HMO) | Admitting: Psychiatry

## 2012-12-09 ENCOUNTER — Encounter (HOSPITAL_COMMUNITY): Payer: Self-pay | Admitting: Psychiatry

## 2012-12-09 DIAGNOSIS — F4323 Adjustment disorder with mixed anxiety and depressed mood: Secondary | ICD-10-CM
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
## 2012-12-09 NOTE — Patient Instructions (Addendum)
Use plan your day handouts  Use support system  Use scripture cards Suicidal Feelings, How to Help Yourself Everyone feels sad or unhappy at times, but depressing thoughts and feelings of hopelessness can lead to thoughts of suicide. It can seem as if life is too tough to handle. If you feel as though you have reached the point where suicide is the only answer, it is time to let someone know immediately.  HOW TO COPE AND PREVENT SUICIDE  Let family, friends, teachers, or counselors know. Get help. Try not to isolate yourself from those who care about you. Even though you may not feel sociable, talk with someone every day. It is best if it is face-to-face. Remember, they will want to help you.  Eat a regularly spaced and well-balanced diet.  Get plenty of rest.  Avoid alcohol and drugs because they will only make you feel worse and may also lower your inhibitions. Remove them from the home. If you are thinking of taking an overdose of your prescribed medicines, give your medicines to someone who can give them to you one day at a time. If you are on antidepressants, let your caregiver know of your feelings so he or she can provide a safer medicine, if that is a concern.  Remove weapons or poisons from your home.  Try to stick to routines. Follow a schedule and remind yourself that you have to keep that schedule every day.  Set some realistic goals and achieve them. Make a list and cross things off as you go. Accomplishments give a sense of worth. Wait until you are feeling better before doing things you find difficult or unpleasant to do.  If you are able, try to start exercising. Even half-hour periods of exercise each day will make you feel better. Getting out in the sun or into nature helps you recover from depression faster. If you have a favorite place to walk, take advantage of that.  Increase safe activities that have always given you pleasure. This may include playing your favorite  music, reading a good book, painting a picture, or playing your favorite instrument. Do whatever takes your mind off your depression.  Keep your living space well-lighted. GET HELP Contact a suicide hotline, crisis center, or local suicide prevention center for help right away. Local centers may include a hospital, clinic, community service organization, social service provider, or health department.  Call your local emergency services (911 in the [place]).  Call a suicide hotline:  1-800-273-TALK (9404373426) in the [place].  1-800-SUICIDE 585-051-4802) in the [place].  940-136-5560 in the [place] for Spanish-speaking counselors.  4-696-295-2WUX (475) 860-2657) in the [place] for TTY users.  Visit the following websites for information and help:  National Suicide Prevention Lifeline: www.suicidepreventionlifeline.org  Hopeline: www.hopeline.com  [employer] for Suicide Prevention: https://www.ayers.com/  For lesbian, gay, bisexual, transgender, or questioning youth, contact [employer]:  3-664-4-I-HKVQQV (445) 443-5420) in the [place].  www.thetrevorproject.org  In [place], treatment resources are listed in each province with listings available under [employer] for [employer] or similar titles. Another source for Crisis Centres by [place] is located at http://www.suicideprevention.ca/in-crisis-now/find-a-crisis-centre-now/crisis-centres Document Released: 12/07/2002 Document Revised: 08/25/2011 Document Reviewed: 04/27/2007 [facility] Patient Information 2014 [place], [place].

## 2012-12-13 ENCOUNTER — Ambulatory Visit (INDEPENDENT_AMBULATORY_CARE_PROVIDER_SITE_OTHER): Payer: Managed Care, Other (non HMO) | Admitting: Psychiatry

## 2012-12-13 DIAGNOSIS — F4323 Adjustment disorder with mixed anxiety and depressed mood: Secondary | ICD-10-CM

## 2012-12-13 NOTE — Patient Instructions (Signed)
Use plan your day handouts  Practice relaxation 4 times per day  Follow up with one of the recommended psychiatrists   Helping Someone Who Is Suicidal Take threats of suicide seriously. Listen to a suicidal person's thoughts and concerns with compassion. The fact that the person is talking to you is an important sign that he or she trusts you. Reasons for suicide can depend on where we are in life.   The younger person is often depressed over lost love.  The middle-aged person is often depressed over financial problems.  The elderly person is often depressed over health problems. SIGNS IN FAMILY OR FRIENDS WHO ARE SUICIDAL INCLUDE:  Depression which suddenly gets better. Getting over depression is usually a gradual process. A sudden change may mean the person has suddenly thought of suicide as a "solution."  A sudden loss of interest in family and friends and social withdrawal.  Loss of personal hygiene habits and not caring for himself or herself.  Decline in handling of school, work, or other activities.  Injuries which are self-inflicted, such as burning or cutting.  Expressions of helplessness, hopelessness, and a sense of the loss of ability to handle life.  Risk-taking behavior, such as casual sex and drug use. COMMON SUICIDE RISKS INCLUDE:  Death or terminal illness of a relative or friend.  Broken relationships.  Loss of health.  Financial losses.  Chemical abuse (drugs and alcohol).  Previous suicide attempts. If you do not feel adequate to listen or help, ask the person if you can help him or her get help. Ask if you can share the person's concerns with someone else such as a Pharmacist, hospital. Just talking with someone else is helpful and you can be that help by listening. Some helpful tips are:  Listen to the person's thoughts and concerns. Let the person unburden his or her troubles on you.  Let the person know you will not let him or her be alone with  the pain.  Ask the person if he or she is having thoughts of hurting himself or herself.  Ask what you can do to help lessen the pain.  Suggest that the person seek professional help and that you will assist him or her in finding help. Let the person know you will continue to be available to help. GET HELP  Contact a suicide hotline, crisis center, or local suicide prevention center for help right away. Local centers may include a hospital, clinic, community service organization, social service provider, or health department.  Call your local emergency services (911 in the [place]).  Call a suicide hotline:  1-800-273-TALK ((769) 828-4442) in the [place].  1-800-SUICIDE (217)011-2981) in the [place].  570-389-3145 in the [place] for Spanish-speaking counselors.  4-132-440-1UUV 928-313-3075) in the [place] for TTY users.  Visit the following websites for information and help:  National Suicide Prevention Lifeline: www.suicidepreventionlifeline.org  Hopeline: www.hopeline.com  [employer] for Suicide Prevention: https://www.ayers.com/  For lesbian, gay, bisexual, transgender, or questioning youth, contact [employer]:  4-259-5-G-LOVFIE 438-097-0057) in the [place].  www.thetrevorproject.org  In [place], treatment resources are listed in each province with listings available under [employer] for [employer] or similar titles. Another source for Crisis Centres by [place] is located at http://www.suicideprevention.ca/in-crisis-now/find-a-crisis-centre-now/crisis-centres Document Released: 12/07/2002 Document Revised: 08/25/2011 Document Reviewed: 02/08/2008 [facility] Patient Information 2014 [place], [place].

## 2012-12-14 ENCOUNTER — Telehealth (HOSPITAL_COMMUNITY): Payer: Self-pay | Admitting: Psychiatry
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
## 2012-12-14 NOTE — Telephone Encounter (Signed)
Therapist contacted patient to follow up regarding referral to psychiatrists. Patient reports contacting a psychiatrist recommended by her attorney. She is expecting a call from the psychiatrist today and hopes to have an appointment by the end of the week.  Therapist informed patient about 2 more possible sources for psychiatrists should she need these.

## 2012-12-16 ENCOUNTER — Ambulatory Visit (HOSPITAL_COMMUNITY): Payer: Self-pay | Admitting: Psychiatry

## 2012-12-20 ENCOUNTER — Ambulatory Visit (HOSPITAL_COMMUNITY): Payer: Self-pay | Admitting: Psychiatry

## 2013-03-31 ENCOUNTER — Encounter (HOSPITAL_COMMUNITY): Payer: Self-pay | Admitting: Psychiatry

## 2013-03-31 NOTE — Progress Notes (Signed)
Outpatient Therapist Discharge Summary  [name]    29-Nov-1965   Admission Date: 12/09/2012  Discharge Date:  03/31/2013  Reason for Discharge: Patient decided to pursue treatment from another provider.  Diagnosis:  Axis I:  Adjustment Disorder with Mixed Anxiety and Depressed Mood   Florencia Reasons, MSW, LCSW

## 2013-04-01 ENCOUNTER — Other Ambulatory Visit (HOSPITAL_COMMUNITY): Payer: Self-pay | Admitting: Nurse Practitioner

## 2013-04-01 DIAGNOSIS — Z139 Encounter for screening, unspecified: Secondary | ICD-10-CM

## 2013-04-14 ENCOUNTER — Ambulatory Visit (HOSPITAL_COMMUNITY)
Admission: RE | Admit: 2013-04-14 | Discharge: 2013-04-14 | Disposition: A | Discharge status: Home / Self Care | Payer: BC Managed Care – PPO | Source: Ambulatory Visit | Attending: Nurse Practitioner | Admitting: Nurse Practitioner

## 2013-04-14 DIAGNOSIS — Z1231 Encounter for screening mammogram for malignant neoplasm of breast: Secondary | ICD-10-CM | POA: Insufficient documentation

## 2013-04-14 DIAGNOSIS — Z139 Encounter for screening, unspecified: Secondary | ICD-10-CM

## 2014-05-11 ENCOUNTER — Emergency Department (HOSPITAL_COMMUNITY)
Admission: EM | Admit: 2014-05-11 | Discharge: 2014-05-11 | Disposition: A | Discharge status: Home / Self Care | Payer: PRIVATE HEALTH INSURANCE | Attending: Emergency Medicine | Admitting: Emergency Medicine

## 2014-05-11 ENCOUNTER — Emergency Department (HOSPITAL_COMMUNITY): Payer: PRIVATE HEALTH INSURANCE

## 2014-05-11 ENCOUNTER — Encounter (HOSPITAL_COMMUNITY): Payer: Self-pay | Admitting: Emergency Medicine

## 2014-05-11 DIAGNOSIS — F4323 Adjustment disorder with mixed anxiety and depressed mood: Secondary | ICD-10-CM | POA: Insufficient documentation

## 2014-05-11 DIAGNOSIS — Z79899 Other long term (current) drug therapy: Secondary | ICD-10-CM | POA: Diagnosis not present

## 2014-05-11 DIAGNOSIS — R062 Wheezing: Secondary | ICD-10-CM | POA: Diagnosis present

## 2014-05-11 DIAGNOSIS — J209 Acute bronchitis, unspecified: Secondary | ICD-10-CM

## 2014-05-11 DIAGNOSIS — Z8701 Personal history of pneumonia (recurrent): Secondary | ICD-10-CM | POA: Diagnosis not present

## 2014-05-11 DIAGNOSIS — Z720 Tobacco use: Secondary | ICD-10-CM | POA: Insufficient documentation

## 2014-05-11 DIAGNOSIS — Z8739 Personal history of other diseases of the musculoskeletal system and connective tissue: Secondary | ICD-10-CM | POA: Insufficient documentation

## 2014-05-11 DIAGNOSIS — J42 Unspecified chronic bronchitis: Secondary | ICD-10-CM

## 2014-05-11 DIAGNOSIS — J40 Bronchitis, not specified as acute or chronic: Secondary | ICD-10-CM | POA: Diagnosis not present

## 2014-05-11 HISTORY — DX: Occipital neuralgia: M54.81

## 2014-05-11 HISTORY — DX: Adjustment disorder with mixed anxiety and depressed mood: F43.23

## 2014-05-11 MED ORDER — IPRATROPIUM BROMIDE 0.02 % IN SOLN
1.0000 mg | Freq: Once | RESPIRATORY_TRACT | Status: AC
  Administered 2014-05-11: 1 mg via RESPIRATORY_TRACT
  Filled 2014-05-11: qty 5

## 2014-05-11 MED ORDER — PREDNISONE 50 MG PO TABS
60.0000 mg | ORAL_TABLET | Freq: Once | ORAL | Status: AC
  Administered 2014-05-11: 60 mg via ORAL
  Filled 2014-05-11 (×2): qty 1

## 2014-05-11 MED ORDER — ALBUTEROL (5 MG/ML) CONTINUOUS INHALATION SOLN
10.0000 mg/h | INHALATION_SOLUTION | Freq: Once | RESPIRATORY_TRACT | Status: AC
  Administered 2014-05-11: 10 mg/h via RESPIRATORY_TRACT
  Filled 2014-05-11: qty 20

## 2014-05-11 MED ORDER — BENZONATATE 100 MG PO CAPS: 100.0000 mg | ORAL_CAPSULE | Freq: Three times a day (TID) | ORAL | Status: DC | PRN

## 2014-05-11 MED ORDER — PREDNISONE 20 MG PO TABS: 40.0000 mg | ORAL_TABLET | Freq: Every day | ORAL | Status: DC

## 2014-05-11 MED ORDER — IPRATROPIUM-ALBUTEROL 0.5-2.5 (3) MG/3ML IN SOLN: 3.0000 mL | Freq: Once | RESPIRATORY_TRACT | Status: DC

## 2014-05-11 NOTE — ED Notes (Signed)
Pt has been treated by [facility] for  URI, pt states wheezing is worse today.  Had 2 breathing treatments at home.

## 2014-05-11 NOTE — ED Notes (Signed)
RT paged.
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
## 2014-05-11 NOTE — ED Notes (Signed)
Ambulated patient around the nurses station 2xs. Sats stayed around 98 and Heart rate 99

## 2014-05-11 NOTE — ED Notes (Addendum)
After talking with pt and pt family, pt to complete nebulizer treatment prior to going to chest xray.

## 2014-05-11 NOTE — Discharge Instructions (Signed)
°Emergency Department Resource Guide °1) Find a Doctor and Pay Out of Pocket °Although you won't have to find out who is covered by your insurance plan, it is a good idea to ask around and get recommendations. You will then need to call the office and see if the doctor you have chosen will accept you as a new patient and what types of options they offer for patients who are self-pay. Some doctors offer discounts or will set up payment plans for their patients who do not have insurance, but you will need to ask so you aren't surprised when you get to your appointment. ° °2) Contact Your Local Health Department °Not all health departments have doctors that can see patients for sick visits, but many do, so it is worth a call to see if yours does. If you don't know where your local health department is, you can check in your phone book. The CDC also has a tool to help you locate your state's health department, and many state websites also have listings of all of their local health departments. ° °3) Find a Walk-in Clinic °If your illness is not likely to be very severe or complicated, you may want to try a walk in clinic. These are popping up all over the country in pharmacies, drugstores, and shopping centers. They're usually staffed by nurse practitioners or physician assistants that have been trained to treat common illnesses and complaints. They're usually fairly quick and inexpensive. However, if you have serious medical issues or chronic medical problems, these are probably not your best option. ° °No Primary Care Doctor: °- Call Health Connect at  832-8000 - they can help you locate a primary care doctor that  accepts your insurance, provides certain services, etc. °- Physician Referral Service- 1-800-533-3463 ° °Chronic Pain Problems: °Organization         Address  Phone   Notes  °[facility]  (336) 297-2271 Patients need to be referred by their primary care doctor.  ° °Medication  Assistance: °Organization         Address  Phone   Notes  °[place] [facility] [address] °[place], [place] [address] (336) 641-8030 --Must be a resident of [place] °-- Must have NO insurance coverage whatsoever (no Medicaid/ Medicare, etc.) °-- The pt. MUST have a primary care doctor that directs their care regularly and follows them in the community °  °[facility]  (866) 331-1348   °[employer] Way  (888) 892-1162   ° °Agencies that provide inexpensive medical care: °Organization         Address  Phone   Notes  °[facility]  (336) 832-8035   °[facility]    (336) 832-7272   °[facility] [address] °[place], [place] [address] (336) 832-4777   °[facility] [address], °[place] (336) 271-4999   °[facility]    (336) 373-0678   °[facility]    (336) 272-1050   °[facility] ° [address], [place] Phone:  (336) 832-4444, Fax:  (336) 832-4440 Hours of Operation:  9 am - 6 pm, M-F.  Also accepts Medicaid/Medicare and self-pay.  °[facility] ° [address], [place] Phone: (336) [phone], Fax: (336) [phone]. Hours of Operation:  8:30 am - 5:30 pm, M-F.  Also accepts Medicaid and self-pay.  °[facility] [place] [address], [place] Phone: (336) 878-6027   °[facility] [address], [place], [place] (336)723-1848, Ext. 123 Mondays & Thursdays: 7-9 AM.  First 15 patients are seen on a first come, first serve basis. °  ° °Medicaid-accepting [place] Providers: ° °Organization         Address  Phone   Notes  °[facility] [address], [place] (336) 641-2100 Also accepts self-pay patients.  °[facility] [address], [place] ° (336) 856-9996   °[facility] [address], [place]  (336) 288-8857   °[facility] [address] [place] [address], [place] (336) 299-7000   °Veita Bland [address], [place]  ° (336) 373-1557 Only accepts [place] Access Medicaid patients after they have their name applied to their card.  ° °Self-Pay (no insurance) in [place]: ° °Organization         Address  Phone   Notes  °Sickle Cell Patients, [facility] [address], [place] (336) 832-1970   °[facility] [address], [place] (336) 832-4400   °[facility] ° [address], [place] (336) 992-4800   °[facility]/Dr. Osei-Bonsu ° [address] [place] [address], [place] or [address], [place] (336) 841-8500 Phone number for both [place] and [place] locations is the same.  °[facility] [address], [place] (336) 299-0000   °[facility] [address] [place] [address], [place] or [address] (336) 852-7530 °(336) 878-2260   °[facility] [address], [place] (336) 350-1642, phone; (336) 294-5005, fax Sees patients 1st and 3rd Saturday of every month.  Must not qualify for public or private insurance (i.e. Medicaid, Medicare, [place] Health Choice, Veterans' Benefits) • Household income should be no more than 200% of the poverty level •The clinic cannot treat you if you are pregnant or think you are pregnant • Sexually transmitted diseases are not treated at the clinic.  ° ° °Dental Care: °Organization         Address  Phone  Notes  °[place] [facility] [address], [place] (336) 641-6152 Accepts children up to age 21 who are enrolled in Medicaid or [place] Health Choice; pregnant women with a Medicaid card; and children who have applied for Medicaid or [place] Health Choice, but were declined, whose parents can pay a reduced fee at time of service.  °[place]  [facility] [place]  [address], [place] (336) 641-7733 Accepts children up to age 21 who are enrolled in Medicaid or [place] Health Choice; pregnant women with a Medicaid card; and children who have applied for Medicaid or [place] Health Choice, but were declined, whose parents can pay a reduced fee at time of service.  °[facility] ° [address], [place] (336) 641-4533 Patients are seen by appointment only. Walk-ins are not accepted. [facility] will see patients 18 years of age and older. °Monday - Tuesday (8am-5pm) °Most Wednesdays (8:30-5pm) °$30 per visit, cash only  °[facility] ° [address], [place] (336) 641-4533 Patients are seen by appointment only. Walk-ins are not accepted. [facility] will see patients 18 years of age and older. °One   Wednesday Evening (Monthly: Volunteer Based).  $30 per visit, cash only  °[facility]  (919) 537-3737 for adults; Children under age 4, call Graduate Pediatric Dentistry at ([phone]) [phone]. Children aged 4-14, please call (919) [phone] to request a pediatric application. ° Dental services are provided in all areas of dental care including fillings, crowns and bridges, complete and partial dentures, implants, gum treatment, root canals, and extractions. Preventive care is also provided. Treatment is provided to both adults and children. °Patients are selected via a lottery and there is often a waiting list. °  °[facility] [address], °[place] ° (336) 763-8833 [email or web address] °  °[facility] [address], [place], [place] (336)723-1848, Ext. 123 Second and Fourth Thursday of each month, opens at 6:30 AM; Clinic ends at 9 AM.  Patients are seen on a first-come first-served basis, and a limited number are seen during each clinic.  ° °[facility] ° [address], [place], [place] (336) 723-7904    Eligibility Requirements °You must have lived in [place], [place], or [place] counties for at least the last three months. °  You cannot be eligible for state or federal sponsored healthcare insurance, including Veterans Administration, Medicaid, or Medicare. °  You generally cannot be eligible for healthcare insurance through your employer.  °  How to apply: °Eligibility screenings are held every Tuesday and Wednesday afternoon from 1:00 pm until 4:00 pm. You do not need an appointment for the interview!  °[facility] [address], [place], [place] 336-631-2330   °[facility]  336-342-8273   °[facility]  336-703-3100   °[facility]  336-570-6415   ° °Behavioral Health Resources in the Community: °Intensive Outpatient Programs °Organization         Address  Phone  Notes  °[facility] [address], [place], [place] 336-878-6098   °[facility] [address], [place], [place] 336-832-9800   °[facility] [address], [place], [place] ° 336-882-2125   °[facility] [address],  °[place], [place] 1-800-853-5163 or 336-641-4981   °Substance Abuse Resources °Organization         Address  Phone  Notes  °[facility]  336-882-2125   °[facility]  336-784-9470   °[facility]  336-285-9073   °[facility]  336-845-3988   °Residential & Outpatient Substance Abuse Program  1-800-659-3381   °Psychological Services °Organization         Address  Phone  Notes  °[facility]  336- 832-9600   °[facility]  336- 378-7881   °[facility] [address], [place] 1-800-853-5163 or 336-641-4981   ° °Mobile Crisis Teams °Organization         Address  Phone  Notes  °[facility], Mobile Crisis Care Unit  1-877-626-1772   °Assertive °[facility] ° [address]  [place], [place] 336-834-9664   °Sharon DeEsch [address] °[place] 336-554-5454   ° °Self-Help/Support Groups °Organization         Address  Phone             Notes  °[facility]  - variety of support groups  336- 373-1402 Call for more information  °Narcotics Anonymous (NA), [facility] [address], °[place]  2 meetings at this location  ° °  Residential Treatment Programs Organization         Address  Phone  Notes  [facility] [address],    [place]  [phone]   [facility]  [address], [place] [id number], [place], [place] [phone]   [facility] [address], [place] [phone]) [phone] Admissions: 8am-3pm M-F  [facility] [address]    [place], [place] [phone]   [facility] [address], [place], [place] [phone]   [facility] [address]  [place], [place] [phone]   [facility] Intensive Outpatient [address], [place], [place] [phone]   [facility] [address]  [place], [place] [phone] or [phone]) [phone]   [facility] ([facility] [address] [place], [place] [phone] Accepts Medicaid  [facility] [address]  [place] [phone] Substance Abuse/Addiction Treatment   [facility]         Address  Phone  Notes  [facility]  [phone](888) [phone]   Angie FavaJulie Brannon, PhD [address] [place], [place]   ([phone](336) [phone] or (928)[phone](336) [phone]   [facility]   [address] [place], [place] [phone](336) [phone]   [facility] [address], [place], [place] [phone](336) ([phone] Insurance/Medicaid/sponsorship through [facility] [address]                                    [place], [place] [phone](336) [phone] Therapy/tele-psych/case    [facility] [address]   [place], [place] [phone]) [phone]    Dr. Lolly MustacheArfeen  [phone]) [phone]   [facility] [place]  [employer] [facility] 1) [address], [place] 2) [address], [place] 3)  [address], [place] [phone](336) [phone](336) [phone]  [phone](336) [phone]   [facility] (617) 321-6301(336) (713)695-5360 or [phone](336) [phone] (After Hours)      Take the prescriptions as directed.  Use your albuterol inhaler (2 to 4 puffs) or your albuterol nebulizer (1 unit dose) every 4 hours for the next 7 days, then as needed for cough, wheezing, or shortness of breath.  Call your regular medical doctor tomorrow morning to confirm your previously scheduled follow up appointment within the next 4 days.  Return to the Emergency Department immediately sooner if worsening.

## 2014-05-11 NOTE — ED Notes (Addendum)
Pt refusing to take po prednisone. EDP aware and at bedside talking with family about treatment plan.

## 2014-08-09 ENCOUNTER — Ambulatory Visit (INDEPENDENT_AMBULATORY_CARE_PROVIDER_SITE_OTHER): Payer: PRIVATE HEALTH INSURANCE | Admitting: Neurology

## 2014-08-09 ENCOUNTER — Telehealth: Payer: Self-pay | Admitting: *Deleted

## 2014-08-09 ENCOUNTER — Encounter: Payer: Self-pay | Admitting: Neurology

## 2014-08-09 VITALS — BP 141/86 | HR 78 | Resp 16 | Ht 63.0 in | Wt 190.0 lb

## 2014-08-09 DIAGNOSIS — G44229 Chronic tension-type headache, not intractable: Secondary | ICD-10-CM

## 2014-08-09 DIAGNOSIS — H9202 Otalgia, left ear: Secondary | ICD-10-CM

## 2014-08-09 DIAGNOSIS — M5481 Occipital neuralgia: Secondary | ICD-10-CM

## 2014-08-09 DIAGNOSIS — M542 Cervicalgia: Secondary | ICD-10-CM

## 2014-08-09 MED ORDER — ANTIPYRINE-BENZOCAINE 5.4-1.4 % OT SOLN: 3.0000 [drp] | OTIC | Status: DC | PRN

## 2014-08-09 MED ORDER — DIAZEPAM 5 MG PO TABS: 5.0000 mg | ORAL_TABLET | Freq: Four times a day (QID) | ORAL | Status: DC | PRN

## 2014-08-09 NOTE — Progress Notes (Addendum)
[facility]  Provider:  Dr Jowell Bossi Referring Provider: Catalina Pizza, MD Primary Care Physician:  Catalina Pizza, MD  Chief Complaint  Patient presents with  . Headache    # 10  RM Trigger Point - Ha    HPI:  [name] is a 49 y.o. female here as a revisit , originally seen in a referral from Dr. Margo Aye in [place].   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 Pulse fear about the financial and professional future. She has been able to stop smoking in spite of all these stressors which is a great achievement. She is now less limited in her ability to exercise she is no longer as short of breath. Her skin color looks much better than I remember her and she also stated that she can't taste food better. In spite of that she has not gained weight. The patient developed a lot of tension neck pain and headaches her trapezius is completely tends her nuchal rigidity is exquisite. I think that the stressors clearly cause this. In addition she has already spoken to her primary care physician, with whom she has tried to use Flexeril to no avail. We should probably invite her for a trigger point injection. She has a history of occipital neuralgia and this will overlap with the current tension neck pain. She had no recent bouts of shingles, trigeminal neuralgia, but in september had a  dental extraction. This extraction did not cause facial pain.     Last vist note , CD 09-2012- The patient was previously last seen for occipital neuralgia on 08/02/2011 she received Celestone and Marcaine and was 14 months pain-free. She returned today after 3 weeks of the occurring neurologic pain study culture and occipital neuralgia. She has at times reached a pain level that sometimes causes nausea or vision problems. Occipital pain radiates upwards to the trauma and down to  the cervical paraspinal area she does not have shoulder pain. The patient informed me that she has meanwhile had multiple bouts of bronchitis, pneumonia and pleural involvement. She is still a daily smoker, she has changed to vapor cigarettes. The patient's husband works for a tobacco company, she has not gained weight , but has discussed with her primary care physician possible weight loss medications. She is somewhat limited in her ability to exercise from the recent infections and shortness of breath.  Review of Systems: Out of a complete 14 system review, the patient complains of only the following symptoms, and all other reviewed systems are negative. Dry mouth, productive cough, neck pain and occipital neuralgia, copd- obstructive,   History   Social History  . Marital Status: Married    Spouse Name: N/A  . Number of Children: 0  . Years of Education: 12 th   Occupational History  . elected official, [employer]    Social History Main Topics  . Smoking status: Former Smoker -- 1.00 packs/day  . Smokeless tobacco: Never Used     Comment: Quit 2016  . Alcohol Use: 0.0 oz/week    6-8 Glasses of wine per week     Comment: 6-8 glasses weekly  . Drug Use: No  . Sexual Activity: Yes    Birth Control/ Protection: Injection   Other Topics Concern  . Not on file   Social History Narrative   Patient lives at  home with her husband [name]).   Patient is unemployed. Works part time at a [employer].   Education 12 th    Right handed.   Caffeine two cups of coffee daily.    Family History  Problem Relation Age of Onset  . Hypertension Brother   . Anxiety disorder Sister   . Anxiety disorder Sister     Past Medical History  Diagnosis Date  . Headache(784.0)   . Cervicalgia   . Bronchitis due to tobacco use   . Pleuritis   . Pneumonia due to adenovirus   . Cervico-occipital neuralgia   . Adjustment disorder with mixed anxiety and depressed mood     Past Surgical  History  Procedure Laterality Date  . Ganglion cyst excision      left hand wrist  . Fracture surgery      Current Outpatient Prescriptions  Medication Sig Dispense Refill  . ALPRAZolam (XANAX) 1 MG tablet Take 0.5-1 mg by mouth every 8 (eight) hours as needed for anxiety.    . medroxyPROGESTERone (DEPO-PROVERA) 150 MG/ML injection Inject 150 mg into the muscle every 3 (three) months.    Marland Kitchen antipyrine-benzocaine (AURALGAN) otic solution Place 3-4 drops into the left ear every 2 (two) hours as needed for ear pain. 10 mL 0  . diazepam (VALIUM) 5 MG tablet Take 1 tablet (5 mg total) by mouth every 6 (six) hours as needed for anxiety, muscle spasms or sedation. 30 tablet 0  . predniSONE (DELTASONE) 20 MG tablet      Current Facility-Administered Medications  Medication Dose Route Frequency Provider Last Rate Last Dose  . betamethasone acetate-betamethasone sodium phosphate (CELESTONE) injection 12 mg  12 mg Intramuscular Once Melvyn Novas, MD      . bupivacaine (MARCAINE) 0.5 % injection 10 mL  10 mL Infiltration Once Melvyn Novas, MD        Allergies as of 08/09/2014  . (No Known Allergies)    Vitals: BP 141/86 mmHg  Pulse 78  Resp 16  Ht  (1.6 m)  Wt 190 lb (86.183 kg)  BMI 33.67 kg/m2 Last Weight:  Wt Readings from Last 1 Encounters:  08/09/14 190 lb (86.183 kg)   Last Height:   Ht Readings from Last 1 Encounters:  08/09/14  (1.6 m)   Vision Screening:   Physical exam:  General: The patient is awake, alert and appears not in acute distress. The patient is well groomed. Head: Normocephalic, atraumatic. [name] reported pain in the left ear, examination shows a blood evidence of a blister but a clear tympanic membrane shiny without a fluid level. He never had shingles. Poor dental status,   Neck is rigid, not menigism, no Brudzinski or Kernig sign , but tender and tense muscles. .  Cardiovascular:  Regular rate and rhythm, without  murmurs or carotid  bruit, and without distended neck veins. Respiratory: Lungs are wheezing in bother middle fields.  Skin:  Without evidence of edema, or rash Trunk: BMI is elevated and patient  has normal posture.  Neurologic exam : The patient is awake and alert, oriented to place and time.   Memory subjective  described as intact. There is a normal attention span & concentration ability.  Speech is non fluent without dysarthria, dysphonia , just slowed - ? aphasia.  Mood and affect are appropriate.  Cranial nerves: Pupils are equal and briskly reactive to light. Funduscopic exam without  evidence of pallor or edema.  Extraocular movements  in vertical and  horizontal planes intact and without nystagmus. Visual fields by finger perimetry are intact. Hearing to finger rub intact.  Facial sensation intact to fine touch. Facial motor strength is symmetric and tongue and uvula move midline. Pain with shoulder and neck movements.   Motor exam:   Normal tone and normal muscle bulk and symmetric normal strength in all extremities. She is able to provide ashoulder shrug, but has pain at the spine midline.  Sensory:  Fine touch, pinprick and vibration were tested in all extremities. Proprioception is  normal. Coordination: Rapid alternating movements in the fingers/hands is tested and normal.  Finger-to-nose maneuver tested and normal without evidence of ataxia, dysmetria or tremor. Gait and station: Patient walks without assistive device and is able and assisted stool climb up to the exam table. Strength within normal limits.  Deep tendon reflexes: in the  upper and lower extremities are symmetric and intact. Babinski maneuver response is downgoing.   Assessment:   This patient with a history of occipital neuralgia and is now pretty presents with exquisite tension neck and headaches. I do think that a trigger point injection would be indicated since she has already failed Flexeril. I will prescribe her a 5 mg  Valium pill for the next 14 days to be taken at night this often helps to things the insomnia resulting and also a muscle relaxation. I hope that this will break the headache cycle I have to find a spot in my appointments for a trigger point injection. If the patient is able to stay I will do this over my lunch break today -  The patient mentioned that she is here for trigger point injections when her appointment was made but was not placed with weight.   Plan:   Rv for triggerpoint injection ASAP.  Patient will use ear drops with steroids in the left ear.  Valium 5 mg for the night, helping with muscle spasms and with sleep, she is under significant stress. No refills.  Elavil 10 mg at night , helps with headaches and with nocturia.     Sheria Rosello  Addendum .  Trigger point injections will be applied today and to the deltoid, upper rim of the trapezius and the scalenus muscles. A mixture of Celestone a steroid and novocaine local anesthetic is used 5 syringes of 2 mL are to be prepared.  I injected the nuchal area paraspinal area at C3 and 4 the mid spinal area over C5-6 muscles injected on each side I used betamethasone sodium phosphate is 30 mg per 5 mg dose and will be for a cane lot #[id number] relation day for  drugs of April 2017 lot number for the betamethasone is 40-30 6-0 0. Exp 08-2014,   The patient's skin was prepared by alcohol preps, the injection did not lead to any bleeding or reaction site on the skin. The patient is already able to move and increase her range of motion for the neck.

## 2014-08-09 NOTE — Addendum Note (Signed)
Addended by: Melvyn NovasHMEIER, Avis Tirone on: 08/09/2014 12:32 PM   Modules accepted: Orders

## 2014-08-09 NOTE — Telephone Encounter (Signed)
Patient calling stating that the ear drops that was prescribed to is no longer on the market. The patient needs another Rx called in. Please advise.

## 2014-08-16 MED ORDER — CIPRODEX 0.3-0.1 % OT SUSP: 4.0000 [drp] | Freq: Two times a day (BID) | OTIC | Status: DC

## 2014-08-17 NOTE — Telephone Encounter (Signed)
I spoke to pt.  Relayed to try ciprodex 4 gtts (prescription states both ears) bid.  If continues with problem to call pcp or ENT.  Pt verbalized understanding.

## 2014-08-17 NOTE — Telephone Encounter (Signed)
Patient stated she hadn't back regarding ear drops.  Patient stated she tried over the counter drops and they haven't worked.  Questioning if an alternative ear drop could be forwarded to [facility] [place].  Please call and advise.

## 2014-09-07 ENCOUNTER — Ambulatory Visit: Payer: PRIVATE HEALTH INSURANCE | Admitting: Neurology

## 2018-02-08 ENCOUNTER — Other Ambulatory Visit (HOSPITAL_COMMUNITY): Payer: Self-pay | Admitting: Internal Medicine

## 2018-02-08 ENCOUNTER — Ambulatory Visit (HOSPITAL_COMMUNITY)
Admission: RE | Admit: 2018-02-08 | Discharge: 2018-02-08 | Disposition: A | Discharge status: Home / Self Care | Payer: 59 | Source: Ambulatory Visit | Attending: Internal Medicine | Admitting: Internal Medicine

## 2018-02-08 DIAGNOSIS — W19XXXA Unspecified fall, initial encounter: Secondary | ICD-10-CM

## 2018-02-08 DIAGNOSIS — R071 Chest pain on breathing: Secondary | ICD-10-CM | POA: Diagnosis not present

## 2018-02-09 ENCOUNTER — Ambulatory Visit (HOSPITAL_COMMUNITY)
Admission: RE | Admit: 2018-02-09 | Discharge: 2018-02-09 | Disposition: A | Discharge status: Home / Self Care | Payer: 59 | Source: Ambulatory Visit | Attending: Internal Medicine | Admitting: Internal Medicine

## 2018-02-09 ENCOUNTER — Other Ambulatory Visit: Payer: Self-pay | Admitting: Internal Medicine

## 2018-02-09 DIAGNOSIS — R0781 Pleurodynia: Secondary | ICD-10-CM | POA: Diagnosis present

## 2018-02-09 DIAGNOSIS — R0602 Shortness of breath: Secondary | ICD-10-CM | POA: Insufficient documentation

## 2018-04-15 ENCOUNTER — Encounter: Payer: Self-pay | Admitting: Neurology

## 2018-04-15 ENCOUNTER — Encounter: Payer: Self-pay | Admitting: *Deleted

## 2018-04-15 ENCOUNTER — Ambulatory Visit (INDEPENDENT_AMBULATORY_CARE_PROVIDER_SITE_OTHER): Payer: 59 | Admitting: Neurology

## 2018-04-15 VITALS — BP 120/80 | HR 75 | Ht 65.0 in | Wt 160.0 lb

## 2018-04-15 DIAGNOSIS — G44209 Tension-type headache, unspecified, not intractable: Secondary | ICD-10-CM

## 2018-04-15 DIAGNOSIS — S46811A Strain of other muscles, fascia and tendons at shoulder and upper arm level, right arm, initial encounter: Secondary | ICD-10-CM | POA: Insufficient documentation

## 2018-04-15 DIAGNOSIS — G44229 Chronic tension-type headache, not intractable: Secondary | ICD-10-CM | POA: Diagnosis not present

## 2018-04-15 DIAGNOSIS — M542 Cervicalgia: Secondary | ICD-10-CM | POA: Insufficient documentation

## 2018-04-15 MED ORDER — CYCLOBENZAPRINE HCL 10 MG PO TABS: 10.0000 mg | ORAL_TABLET | Freq: Three times a day (TID) | ORAL | 1 refills | Status: DC | PRN

## 2018-04-15 NOTE — Addendum Note (Signed)
Addended by: Melvyn Novas on: 04/15/2018 02:34 PM   Modules accepted: Orders

## 2018-04-15 NOTE — Progress Notes (Addendum)
Provider:  Melvyn Novas, M D  Referring Provider: Benita Stabile, MD Primary Care Physician:  Benita Stabile, MD  Chief Complaint  Patient presents with  . "[name] in neck"    rm 11, "fainted and fell a few months agoo, that's when my neck started hurting; back of head pain and goes to both shoulders; prior shots worked before after 24 hr rest""    HPI:  [name] is a 52 y.o. female patient who has been seen almost 4 years ago. She has been a smoker, with a most history of somatization , tension neck pain and headaches. Stress was always a trigger. She is seen here again on 04-15-2018 upon referral from Dr. Margo Aye. [name] reports that it took  2 months since the onset of symptoms before she could be seen here today, she describes a tight tendons and dull pulling pain that can intensify with rotation of her head flexion of the neck/  retroflexion of the neck- lateral tilt is better tolerated. The location is on top of the right shoulder- to top of the shoulder blade but she also points to the trapezius muscle.  She has already experimented and reports that rest and heat improve her pain.  Social history - She has been separated from her husband , this is the first marital crisis she reports- this separation took place at the time her neck started hurting.  Her husband is an alcoholic- and gets mean when he drinks. He has a job, she is working full time at a Company secretary.   She is still smoking 1-1/2 ppd.  She drinks no alcohol, and only 2 cups of coffee , no other sources of caffeine.    Review of Systems: Out of a complete 14 system review, the patient complains of only the following symptoms, and all other reviewed systems are negative. she fell 2 month ago right after her separation- she feels she fainted - LOC.  No nausea. Depressed, reports pain all over but mostly at neck , with ROM. Dull and tension related pain   Social History   Socioeconomic History  .  Marital status: Married    Spouse name: Not on file  . Number of children: 0  . Years of education: 69 th  . Highest education level: Not on file  Occupational History    Comment: [employer]  Social Needs  . Financial resource strain: Not on file  . Food insecurity:    Worry: Not on file    Inability: Not on file  . Transportation needs:    Medical: Not on file    Non-medical: Not on file  Tobacco Use  . Smoking status: Current Every Day Smoker    Packs/day: 0.50  . Smokeless tobacco: Never Used  . Tobacco comment: Quit 2016, 03/2018 smoking  Substance and Sexual Activity  . Alcohol use: Yes    Alcohol/week: 6.0 - 8.0 standard drinks    Types: 6 - 8 Glasses of wine per week    Comment: 6-8 glasses weekly, 04/15/18 1 /month  . Drug use: No  . Sexual activity: Yes    Birth control/protection: Injection  Lifestyle  . Physical activity:    Days per week: Not on file    Minutes per session: Not on file  . Stress: Not on file  Relationships  . Social connections:    Talks on phone: Not on file    Gets together: Not  on file    Attends religious service: Not on file    Active member of club or organization: Not on file    Attends meetings of clubs or organizations: Not on file    Relationship status: Not on file  . Intimate partner violence:    Fear of current or ex partner: Not on file    Emotionally abused: Not on file    Physically abused: Not on file    Forced sexual activity: Not on file  Other Topics Concern  . Not on file  Social History Narrative   Patient lives at home with her husband [name]). 04/15/18 separated      Patient is unemployed. Works part time at a [employer].   Education 12 th    Right handed.   Caffeine two cups of coffee daily.    Family History  Problem Relation Age of Onset  . Hypertension Brother   . Anxiety disorder Sister   . Anxiety disorder Sister     Past Medical History:  Diagnosis Date  . Adjustment disorder with  mixed anxiety and depressed mood   . Bronchitis due to tobacco use   . Cervicalgia   . Cervico-occipital neuralgia   . Headache(784.0)   . Pleuritis   . Pneumonia due to adenovirus     Past Surgical History:  Procedure Laterality Date  . FRACTURE SURGERY    . GANGLION CYST EXCISION     left hand wrist    Current Outpatient Medications  Medication Sig Dispense Refill  . albuterol (PROVENTIL HFA;VENTOLIN HFA) 108 (90 Base) MCG/ACT inhaler INL 2 PFS PO Q 6 H PRF SOB OR WHZ  1  . ALPRAZolam (XANAX) 1 MG tablet Take 0.5-1 mg by mouth every 8 (eight) hours as needed for anxiety.    Marland Kitchen atorvastatin (LIPITOR) 10 MG tablet TK 1 T PO Q NIGHT  0  . diazepam (VALIUM) 5 MG tablet Take 1 tablet (5 mg total) by mouth every 6 (six) hours as needed for anxiety, muscle spasms or sedation. 30 tablet 0  . diclofenac sodium (VOLTAREN) 1 % GEL APPLY 2 TO 3 GRAMS AA TID PRN P  0  . antipyrine-benzocaine (AURALGAN) otic solution Place 3-4 drops into the left ear every 2 (two) hours as needed for ear pain. (Patient not taking: Reported on 04/15/2018) 10 mL 0  . CIPRODEX otic suspension Place 4 drops into both ears 2 (two) times daily. (Patient not taking: Reported on 04/15/2018) 7.5 mL 0  . medroxyPROGESTERone (DEPO-PROVERA) 150 MG/ML injection Inject 150 mg into the muscle every 3 (three) months.    . predniSONE (DELTASONE) 20 MG tablet      Current Facility-Administered Medications  Medication Dose Route Frequency Provider Last Rate Last Dose  . betamethasone acetate-betamethasone sodium phosphate (CELESTONE) injection 12 mg  12 mg Intramuscular Once Damari Hiltz, Porfirio Mylar, MD      . bupivacaine (MARCAINE) 0.5 % injection 10 mL  10 mL Infiltration Once Malay Fantroy, Porfirio Mylar, MD        Allergies as of 04/15/2018  . (No Known Allergies)    Vitals: BP 120/80   Pulse 75   Ht 5\' 5"  (1.651 m)   Wt 160 lb (72.6 kg)   BMI 26.63 kg/m  Last Weight:  Wt Readings from Last 1 Encounters:  04/15/18 160 lb (72.6 kg)    Last Height:   Ht Readings from Last 1 Encounters:  04/15/18 5\' 5"  (1.651 m)    Physical exam:  General: The patient  is awake, alert and appears in acute distress.  Head: Normocephalic, atraumatic. Neck is supple. Cardiovascular:  Regular rate and rhythm , without  murmurs or carotid bruit, and without distended neck veins. Respiratory: Lungs are clear to auscultation. Skin:  Without evidence of edema, or rash Trunk: BMI is 26.6 elevated and patient has normal posture.  Neurologic exam : The patient is awake and alert, oriented to place and time.  Memory subjective described as intact. There is a normal attention span & concentration ability.  Speech is fluent without dysarthria, dysphonia or aphasia. Mood and affect are appropriate.  Cranial nerves: Pupils are equal and briskly reactive to light. Extraocular movements  in vertical and horizontal planes intact and without nystagmus.  Visual fields by finger perimetry are intact. Hearing to finger rub intact. Facial sensation intact to fine touch. Facial motor strength is symmetric and tongue and uvula move midline. Tongue protrusion into either cheek is normal.  ROM is limited by Shoulder shrug is normal.  Motor exam:  Normal tone ,muscle bulk and symmetric  strength in all extremities. Sensory:  Fine touch, pinprick and vibration were tested in all extremities. Proprioception was normal. Coordination: Rapid alternating movements in the fingers/hands were normal. Finger-to-nose maneuver  normal without evidence of ataxia, dysmetria or tremor. Gait and station: Patient walks without assistive device . Deep tendon reflexes: in the  upper and lower extremities are symmetric and intact.   Assessment:  After physical and neurologic examination, review of laboratory studies, imaging, neurophysiology testing and pre-existing records, assessment is that of :   Tension neck pain and shoulder pain. No radiculopathic pain radiation.  Sometimes she has thumb and index finger numbness.   Plan:  Treatment plan and additional workup :  Order PT, ROM of neck and shoulder.,  Continue NSAIDS with food.  Muscle relaxant to be used at home,  Will write for Flexaril at night.  If no improvement in 2 weeks will order NCV and EMG, trigger point injections and consider  MRI or CT spine.    Porfirio Mylar May Manrique MD 04/15/2018

## 2018-04-15 NOTE — Patient Instructions (Signed)

## 2018-04-27 ENCOUNTER — Encounter: Payer: Self-pay | Admitting: Neurology

## 2018-04-27 ENCOUNTER — Ambulatory Visit (INDEPENDENT_AMBULATORY_CARE_PROVIDER_SITE_OTHER): Payer: 59 | Admitting: Neurology

## 2018-04-27 VITALS — BP 128/77 | HR 82 | Ht 65.0 in | Wt 166.0 lb

## 2018-04-27 DIAGNOSIS — M5481 Occipital neuralgia: Secondary | ICD-10-CM

## 2018-04-27 NOTE — Progress Notes (Signed)
Nerve block with steroid: trigger point for occipital neuralgia.   Pt signed consent :yes  0.5% Bupivocaine 2 mL LOT: [id number] EXP: 06/2019 NDC: 47425-956-38  Dexametharsone 4 mg/ml-  8mg - 2ml given LOT: [id number] EXP: 05/20  Patient [name] presented today with occipital neuralgia and severe tension of the scalenus and trapezius muscles, I injected the left side only, in 8 injections over 4 muscles and glabella insertion. No bleed, no blood aspiration, no difficulties.   Melvyn Novas, MD

## 2018-04-27 NOTE — Patient Instructions (Signed)
Rv in 3 month with np or me.

## 2018-06-02 ENCOUNTER — Other Ambulatory Visit (HOSPITAL_COMMUNITY): Payer: Self-pay | Admitting: Internal Medicine

## 2018-06-02 DIAGNOSIS — Z1231 Encounter for screening mammogram for malignant neoplasm of breast: Secondary | ICD-10-CM

## 2018-06-02 DIAGNOSIS — Z780 Asymptomatic menopausal state: Secondary | ICD-10-CM

## 2018-06-03 ENCOUNTER — Institutional Professional Consult (permissible substitution): Payer: PRIVATE HEALTH INSURANCE | Admitting: Neurology

## 2018-06-07 ENCOUNTER — Other Ambulatory Visit: Payer: Self-pay | Admitting: Neurology

## 2018-07-12 ENCOUNTER — Encounter: Payer: Self-pay | Admitting: Neurology

## 2018-07-12 ENCOUNTER — Ambulatory Visit (INDEPENDENT_AMBULATORY_CARE_PROVIDER_SITE_OTHER): Payer: 59 | Admitting: Neurology

## 2018-07-12 VITALS — BP 127/80 | HR 83 | Ht 65.0 in | Wt 167.0 lb

## 2018-07-12 DIAGNOSIS — G44211 Episodic tension-type headache, intractable: Secondary | ICD-10-CM | POA: Diagnosis not present

## 2018-07-12 DIAGNOSIS — G44031 Episodic paroxysmal hemicrania, intractable: Secondary | ICD-10-CM

## 2018-07-12 DIAGNOSIS — M5481 Occipital neuralgia: Secondary | ICD-10-CM | POA: Diagnosis not present

## 2018-07-12 DIAGNOSIS — M542 Cervicalgia: Secondary | ICD-10-CM | POA: Diagnosis not present

## 2018-07-12 NOTE — Progress Notes (Signed)
Nerve block with steroid: trigger point for occipital neuralgia.   Pt signed consent :yes  0.5% Bupivocaine 2 mL LOT: [id number] EXP: 01/2020 NDC: [id number]  Dexametharsone 4 mg/ml-  8mg - 2ml given LOT: [id number] EXP: 05/20

## 2018-07-12 NOTE — Progress Notes (Signed)
Nerve block with steroid: trigger point for occipital neuralgia, and for tension neck pain.   Pt signed consent :yes   0.5% Bupivocaine 2 mL LOT:  EXP:  NDC:   Dexametharsone 4 mg/ml-  8mg - 62ml given LOT:  EXP:   Patient [name] presented today with occipital neuralgia and severe tension pain in the region of the  Bilateral scalenus and trapezius muscles,today more affecting the right side. She has been drowsy with oral application of muscle relaxant medication, unable to drive.    I injected  4 syringes the right side and midline only, in 10  injections over 5 muscles and into the glabella insertion.   No bleeding noted, no aspiration of blood, no symptoms of nausea or lightheadedness.    Melvyn Novas, MD   07-12-2018

## 2018-07-12 NOTE — Patient Instructions (Signed)
Coping with Quitting Smoking  Quitting smoking is a physical and mental challenge. You will face cravings, withdrawal symptoms, and temptation. Before quitting, work with your health care provider to make a plan that can help you cope. Preparation can help you quit and keep you from giving in. How can I cope with cravings? Cravings usually last for 5-10 minutes. If you get through it, the craving will pass. Consider taking the following actions to help you cope with cravings:  Keep your mouth busy: ? Chew sugar-free gum. ? Suck on hard candies or a straw. ? Brush your teeth.  Keep your hands and body busy: ? Immediately change to a different activity when you feel a craving. ? Squeeze or play with a ball. ? Do an activity or a hobby, like making bead jewelry, practicing needlepoint, or working with wood. ? Mix up your normal routine. ? Take a short exercise break. Go for a quick walk or run up and down stairs. ? Spend time in public places where smoking is not allowed.  Focus on doing something kind or helpful for someone else.  Call a friend or family member to talk during a craving.  Join a support group.  Call a quit line, such as 1-800-QUIT-NOW.  Talk with your health care provider about medicines that might help you cope with cravings and make quitting easier for you. How can I deal with withdrawal symptoms? Your body may experience negative effects as it tries to get used to not having nicotine in the system. These effects are called withdrawal symptoms. They may include:  Feeling hungrier than normal.  Trouble concentrating.  Irritability.  Trouble sleeping.  Feeling depressed.  Restlessness and agitation.  Craving a cigarette. To manage withdrawal symptoms:  Avoid places, people, and activities that trigger your cravings.  Remember why you want to quit.  Get plenty of sleep.  Avoid coffee and other caffeinated drinks. These may worsen some of your  symptoms. How can I handle social situations? Social situations can be difficult when you are quitting smoking, especially in the first few weeks. To manage this, you can:  Avoid parties, bars, and other social situations where people might be smoking.  Avoid alcohol.  Leave right away if you have the urge to smoke.  Explain to your family and friends that you are quitting smoking. Ask for understanding and support.  Plan activities with friends or family where smoking is not an option. What are some ways I can cope with stress? Wanting to smoke may cause stress, and stress can make you want to smoke. Find ways to manage your stress. Relaxation techniques can help. For example:  Breathe slowly and deeply, in through your nose and out through your mouth.  Listen to soothing, relaxing music.  Talk with a family member or friend about your stress.  Light a candle.  Soak in a bath or take a shower.  Think about a peaceful place. What are some ways I can prevent weight gain? Be aware that many people gain weight after they quit smoking. However, not everyone does. To keep from gaining weight, have a plan in place before you quit and stick to the plan after you quit. Your plan should include:  Having healthy snacks. When you have a craving, it may help to: ? Eat plain popcorn, crunchy carrots, celery, or other cut vegetables. ? Chew sugar-free gum.  Changing how you eat: ? Eat small portion sizes at meals. ? Eat 4-6 small meals   throughout the day instead of 1-2 large meals a day. ? Be mindful when you eat. Do not watch television or do other things that might distract you as you eat.  Exercising regularly: ? Make time to exercise each day. If you do not have time for a long workout, do short bouts of exercise for 5-10 minutes several times a day. ? Do some form of strengthening exercise, like weight lifting, and some form of aerobic exercise, like running or swimming.  Drinking  plenty of water or other low-calorie or no-calorie drinks. Drink 6-8 glasses of water daily, or as much as instructed by your health care provider. Summary  Quitting smoking is a physical and mental challenge. You will face cravings, withdrawal symptoms, and temptation to smoke again. Preparation can help you as you go through these challenges.  You can cope with cravings by keeping your mouth busy (such as by chewing gum), keeping your body and hands busy, and making calls to family, friends, or a helpline for people who want to quit smoking.  You can cope with withdrawal symptoms by avoiding places where people smoke, avoiding drinks with caffeine, and getting plenty of rest.  Ask your health care provider about the different ways to prevent weight gain, avoid stress, and handle social situations. This information is not intended to replace advice given to you by your health care provider. Make sure you discuss any questions you have with your health care provider. Document Released: 05/30/2016 Document Revised: 05/30/2016 Document Reviewed: 05/30/2016 Elsevier Interactive Patient Education  2019 [employer]. Steps to Quit Smoking  Smoking tobacco can be bad for your health. It can also affect almost every organ in your body. Smoking puts you and people around you at risk for many serious long-lasting (chronic) diseases. Quitting smoking is hard, but it is one of the best things that you can do for your health. It is never too late to quit. What are the benefits of quitting smoking? When you quit smoking, you lower your risk for getting serious diseases and conditions. They can include:  Lung cancer or lung disease.  Heart disease.  Stroke.  Heart attack.  Not being able to have children (infertility).  Weak bones (osteoporosis) and broken bones (fractures). If you have coughing, wheezing, and shortness of breath, those symptoms may get better when you quit. You may also get sick  less often. If you are pregnant, quitting smoking can help to lower your chances of having a baby of low birth weight. What can I do to help me quit smoking? Talk with your doctor about what can help you quit smoking. Some things you can do (strategies) include:  Quitting smoking totally, instead of slowly cutting back how much you smoke over a period of time.  Going to in-person counseling. You are more likely to quit if you go to many counseling sessions.  Using resources and support systems, such as: ? Agricultural engineer with a Veterinary surgeon. ? Phone quitlines. ? Automotive engineer. ? Support groups or group counseling. ? Text messaging programs. ? Mobile phone apps or applications.  Taking medicines. Some of these medicines may have nicotine in them. If you are pregnant or breastfeeding, do not take any medicines to quit smoking unless your doctor says it is okay. Talk with your doctor about counseling or other things that can help you. Talk with your doctor about using more than one strategy at the same time, such as taking medicines while you are also going  to in-person counseling. This can help make quitting easier. What things can I do to make it easier to quit? Quitting smoking might feel very hard at first, but there is a lot that you can do to make it easier. Take these steps:  Talk to your family and friends. Ask them to support and encourage you.  Call phone quitlines, reach out to support groups, or work with a Veterinary surgeon.  Ask people who smoke to not smoke around you.  Avoid places that make you want (trigger) to smoke, such as: ? Bars. ? Parties. ? Smoke-break areas at work.  Spend time with people who do not smoke.  Lower the stress in your life. Stress can make you want to smoke. Try these things to help your stress: ? Getting regular exercise. ? Deep-breathing exercises. ? Yoga. ? Meditating. ? Doing a body scan. To do this, close your eyes, focus on one area  of your body at a time from head to toe, and notice which parts of your body are tense. Try to relax the muscles in those areas.  Download or buy apps on your mobile phone or tablet that can help you stick to your quit plan. There are many free apps, such as QuitGuide from the [employer] developer for Disease Control and Prevention). You can find more support from smokefree.gov and other websites. This information is not intended to replace advice given to you by your health care provider. Make sure you discuss any questions you have with your health care provider. Document Released: 03/29/2009 Document Revised: 01/29/2016 Document Reviewed: 10/17/2014 Elsevier Interactive Patient Education  2019 [employer].

## 2018-08-05 ENCOUNTER — Ambulatory Visit: Payer: 59 | Admitting: Nurse Practitioner

## 2018-08-30 ENCOUNTER — Other Ambulatory Visit: Payer: Self-pay | Admitting: Neurology

## 2018-12-07 ENCOUNTER — Other Ambulatory Visit: Payer: Self-pay | Admitting: Neurology

## 2019-01-18 ENCOUNTER — Encounter: Payer: Self-pay | Admitting: Adult Health

## 2019-01-18 ENCOUNTER — Other Ambulatory Visit: Payer: Self-pay

## 2019-01-18 ENCOUNTER — Ambulatory Visit (INDEPENDENT_AMBULATORY_CARE_PROVIDER_SITE_OTHER): Payer: 59 | Admitting: Adult Health

## 2019-01-18 VITALS — BP 111/73 | HR 82 | Temp 96.9°F | Ht 65.0 in | Wt 180.8 lb

## 2019-01-18 DIAGNOSIS — G44209 Tension-type headache, unspecified, not intractable: Secondary | ICD-10-CM

## 2019-01-18 NOTE — Progress Notes (Signed)
PATIENT: [name] DOB: Oct 19, 1965  REASON FOR VISIT: follow up HISTORY FROM: patient  HISTORY OF PRESENT ILLNESS: Today 01/18/19:  [name] is a 53 year old female with a history of tension type headaches.  She returns today for follow-up.  She states that she has a least 1-2 headaches a month.  She states that typically starts in the right side of the neck and radiates up the back of the head.  She states that sometimes her headaches are in the center of her forehead.  She reports photophobia and phonophobia but denies nausea and vomiting.  She states that she typically will take 800 mg of ibuprofen typically it is not beneficial and she will take a BC powder.  She states putting heat or Voltaren gel on her neck is also helpful.  In the past she reports that she is been referred to physical therapy but due to cost she did not go.  She continues to take Flexeril at bedtime but unsure how much is beneficial.  HISTORY (Copied from Dr.Dohmeier's note) [name] is a 53 y.o. female patient who has been seen almost 4 years ago. She has been a smoker, with a most history of somatization , tension neck pain and headaches. Stress was always a trigger. She is seen here again on 04-15-2018 upon referral from Dr. Margo AyeHall. [name] reports that it took  2 months since the onset of symptoms before she could be seen here today, she describes a tight tendons and dull pulling pain that can intensify with rotation of her head flexion of the neck/  retroflexion of the neck- lateral tilt is better tolerated. The location is on top of the right shoulder- to top of the shoulder blade but she also points to the trapezius muscle.  She has already experimented and reports that rest and heat improve her pain.  Social history - She has been separated from her husband , this is the first marital crisis she reports- this separation took place at the time her neck started hurting.  Her husband is an alcoholic-  and gets mean when he drinks. He has a job, she is working full time at a [employer] and [employer].   She is still smoking 1-1/2 ppd.  She drinks no alcohol, and only 2 cups of coffee , no other sources of caffeine.   REVIEW OF SYSTEMS: Out of a complete 14 system review of symptoms, the patient complains only of the following symptoms, and all other reviewed systems are negative.  Headache, neck pain  ALLERGIES: No Known Allergies  HOME MEDICATIONS: Outpatient Medications Prior to Visit  Medication Sig Dispense Refill  . albuterol (PROVENTIL HFA;VENTOLIN HFA) 108 (90 Base) MCG/ACT inhaler INL 2 PFS PO Q 6 H PRF SOB OR WHZ  1  . ALPRAZolam (XANAX) 1 MG tablet Take 0.5-1 mg by mouth every 8 (eight) hours as needed for anxiety.    Ailene Ards. ANORO ELLIPTA 62.5-25 MCG/INH AEPB     . atorvastatin (LIPITOR) 10 MG tablet TK 1 T PO Q NIGHT  0  . cyclobenzaprine (FLEXERIL) 10 MG tablet TAKE 1 TABLET BY MOUTH THREE TIMES DAILY EVERY 8 HOURS 90 tablet 1  . diclofenac sodium (VOLTAREN) 1 % GEL APPLY 2 TO 3 GRAMS AA TID PRN P  0  . hydrochlorothiazide (HYDRODIURIL) 25 MG tablet TK 1 T PO  QAM    . levofloxacin (LEVAQUIN) 750 MG tablet TK 1 T PO  QD FOR 7 DAYS    .  CIPRODEX otic suspension Place 4 drops into both ears 2 (two) times daily. 7.5 mL 0  . medroxyPROGESTERone (DEPO-PROVERA) 150 MG/ML injection Inject 150 mg into the muscle every 3 (three) months.    . predniSONE (DELTASONE) 20 MG tablet      Facility-Administered Medications Prior to Visit  Medication Dose Route Frequency Provider Last Rate Last Dose  . betamethasone acetate-betamethasone sodium phosphate (CELESTONE) injection 12 mg  12 mg Intramuscular Once Dohmeier, Asencion Partridge, MD      . bupivacaine (MARCAINE) 0.5 % injection 10 mL  10 mL Infiltration Once Dohmeier, Asencion Partridge, MD        PAST MEDICAL HISTORY: Past Medical History:  Diagnosis Date  . Adjustment disorder with mixed anxiety and depressed mood   . Bronchitis due to tobacco use   .  Cervicalgia   . Cervico-occipital neuralgia   . Headache(784.0)   . Pleuritis   . Pneumonia due to adenovirus     PAST SURGICAL HISTORY: Past Surgical History:  Procedure Laterality Date  . FRACTURE SURGERY    . GANGLION CYST EXCISION     left hand wrist    FAMILY HISTORY: Family History  Problem Relation Age of Onset  . Hypertension Brother   . Anxiety disorder Sister   . Anxiety disorder Sister     SOCIAL HISTORY: Social History   Socioeconomic History  . Marital status: Married    Spouse name: Not on file  . Number of children: 0  . Years of education: 87 th  . Highest education level: Not on file  Occupational History    Comment: [employer]  . Financial resource strain: Not on file  . Food insecurity    Worry: Not on file    Inability: Not on file  . Transportation needs    Medical: Not on file    Non-medical: Not on file  Tobacco Use  . Smoking status: Current Every Day Smoker    Packs/day: 0.50  . Smokeless tobacco: Never Used  . Tobacco comment: Quit 2016, 03/2018 smoking  Substance and Sexual Activity  . Alcohol use: Yes    Alcohol/week: 6.0 - 8.0 standard drinks    Types: 6 - 8 Glasses of wine per week    Comment: 6-8 glasses weekly, 04/15/18 1 /month  . Drug use: No  . Sexual activity: Yes    Birth control/protection: Injection  Lifestyle  . Physical activity    Days per week: Not on file    Minutes per session: Not on file  . Stress: Not on file  Relationships  . Social Herbalist on phone: Not on file    Gets together: Not on file    Attends religious service: Not on file    Active member of club or organization: Not on file    Attends meetings of clubs or organizations: Not on file    Relationship status: Not on file  . Intimate partner violence    Fear of current or ex partner: Not on file    Emotionally abused: Not on file    Physically abused: Not on file    Forced sexual activity: Not on file   Other Topics Concern  . Not on file  Social History Narrative   Patient lives at home with her husband [name]). 04/15/18 separated      Patient is unemployed. Works part time at a [employer], community.   Education 12 th    Right handed.   Caffeine  two cups of coffee daily.      PHYSICAL EXAM  Vitals:   01/18/19 1435  BP: 111/73  Pulse: 82  Temp: (!) 96.9 F (36.1 C)  Weight: 180 lb 12.8 oz (82 kg)  Height: 5\' 5"  (1.651 m)   Body mass index is 30.09 kg/m.  Generalized: Well developed, in no acute distress   Neurological examination  Mentation: Alert oriented to time, place, history taking. Follows all commands speech and language fluent Cranial nerve II-XII: Pupils were equal round reactive to light. Extraocular movements were full, visual field were full on confrontational test. Facial sensation and strength were normal. Uvula tongue midline. Head turning and shoulder shrug  were normal and symmetric. Motor: The motor testing reveals 5 over 5 strength of all 4 extremities. Good symmetric motor tone is noted throughout.  Muscle tightness noted in the right trapezius muscle. Sensory: Sensory testing is intact to soft touch on all 4 extremities. No evidence of extinction is noted.  Coordination: Cerebellar testing reveals good finger-nose-finger and heel-to-shin bilaterally.  Gait and station: Gait is normal. Tandem gait is normal. Romberg is negative. No drift is seen.  Reflexes: Deep tendon reflexes are symmetric and normal bilaterally.   DIAGNOSTIC DATA (LABS, IMAGING, TESTING) - I reviewed patient records, labs, notes, testing and imaging myself where available.  Lab Results  Component Value Date   WBC 15.1 (H) 12/06/2012   HGB 15.7 (H) 12/06/2012   HCT 43.8 12/06/2012   MCV 96.5 12/06/2012   PLT 357 12/06/2012      Component Value Date/Time   NA 137 12/06/2012 2200   K 3.7 12/06/2012 2200   CL 98 12/06/2012 2200   CO2 24 12/06/2012 2200   GLUCOSE 121 (H) 12/06/2012  2200   BUN 5 (L) 12/06/2012 2200   CREATININE 0.71 12/06/2012 2200   CALCIUM 9.9 12/06/2012 2200   PROT 5.8 (L) 02/28/2008 1658   ALBUMIN 3.6 02/28/2008 1658   AST 30 02/28/2008 1658   ALT 11 02/28/2008 1658   ALKPHOS 69 02/28/2008 1658   BILITOT 1.4 (H) 02/28/2008 1658   GFRNONAA >90 12/06/2012 2200   GFRAA >90 12/06/2012 2200   Lab Results  Component Value Date   CHOL 212 (H) 07/14/2007   HDL 49 07/14/2007   LDLCALC 137 (H) 07/14/2007   TRIG 128 07/14/2007   CHOLHDL 4.3 Ratio 07/14/2007   No results found for: HGBA1C No results found for: VITAMINB12 No results found for: TSH    ASSESSMENT AND PLAN 53 y.o. year old female  has a past medical history of Adjustment disorder with mixed anxiety and depressed mood, Bronchitis due to tobacco use, Cervicalgia, Cervico-occipital neuralgia, Headache(784.0), Pleuritis, and Pneumonia due to adenovirus. here with:  1.  Tension type headaches  We discussed potentially changing to a different muscle relaxer.  The patient would like to hold off at this point.  She reports that she has been started on 2 new medication by her primary care and does not want to change her medications at this point.  We discussed switching from Flexeril to Zanaflex in the future.  We also discussed a referral to physical therapy for neuromuscular therapy.  For now she wants to hold off.  I have advised that if her symptoms worsen or she develops new symptoms she should let us know.  She will follow-up in 6 months or sooner if needed.   I spent 15 minutes with the patient. 50% of this time was spent discussing medication options  Butch PennyMegan Olive Zmuda, MSN, NP-C 01/18/2019, 2:46 PM [facility] [address], [address] [place], [place] [address] [phone]

## 2019-01-18 NOTE — Patient Instructions (Signed)
Your Plan:  Continue Flexeril  Can consider switching to Zanaflex in the future If your symptoms worsen or you develop new symptoms please let us know.   Tizanidine tablets or capsules What is this medicine? TIZANIDINE (tye ZAN i deen) helps to relieve muscle spasms. It may be used to help in the treatment of multiple sclerosis and spinal cord injury. This medicine may be used for other purposes; ask your health care provider or pharmacist if you have questions. COMMON BRAND NAME(S): Zanaflex What should I tell my health care provider before I take this medicine? They need to know if you have any of these conditions:  kidney disease  liver disease  low blood pressure  mental disorder  an unusual or allergic reaction to tizanidine, other medicines, lactose (tablets only), foods, dyes, or preservatives  pregnant or trying to get pregnant  breast-feeding How should I use this medicine? Take this medicine by mouth with a full glass of water. Take this medicine on an empty stomach, at least 30 minutes before or 2 hours after food. Do not take with food unless you talk with your doctor. Follow the directions on the prescription label. Take your medicine at regular intervals. Do not take your medicine more often than directed. Do not stop taking except on your doctor's advice. Suddenly stopping the medicine can be very dangerous. Talk to your pediatrician regarding the use of this medicine in children. Patients over 54 years old may have a stronger reaction and need a smaller dose. Overdosage: If you think you have taken too much of this medicine contact a poison control center or emergency room at once. NOTE: This medicine is only for you. Do not share this medicine with others. What if I miss a dose? If you miss a dose, take it as soon as you can. If it is almost time for your next dose, take only that dose. Do not take double or extra doses. What may interact with this medicine? Do  not take this medicine with any of the following medications:  ciprofloxacin  fluvoxamine  narcotic medicines for cough  thiabendazole This medicine may also interact with the following medications:  acyclovir  alcohol  antihistamines for allergy, cough, and cold  baclofen  certain medicines for anxiety or sleep  certain medicines for blood pressure, heart disease, irregular heartbeat  certain medicines for depression like amitriptyline, fluoxetine, sertraline  certain medicines for seizures like phenobarbital, primidone  certain medicines for stomach problems like cimetidine, famotidine  female hormones, like estrogens or progestins and birth control pills, patches, rings, or injections  general anesthetics like halothane, isoflurane, methoxyflurane, propofol  local anesthetics like lidocaine, pramoxine, tetracaine  medicines that relax muscles for surgery  narcotic medicines for pain  phenothiazines like chlorpromazine, mesoridazine, prochlorperazine  ticlopidine  zileuton This list may not describe all possible interactions. Give your health care provider a list of all the medicines, herbs, non-prescription drugs, or dietary supplements you use. Also tell them if you smoke, drink alcohol, or use illegal drugs. Some items may interact with your medicine. What should I watch for while using this medicine? Tell your doctor or health care professional if your symptoms do not start to get better or if they get worse. You may get drowsy or dizzy. Do not drive, use machinery, or do anything that needs mental alertness until you know how this medicine affects you. Do not stand or sit up quickly, especially if you are an older patient. This reduces the risk  of dizzy or fainting spells. Alcohol may interfere with the effect of this medicine. Avoid alcoholic drinks. If you are taking another medicine that also causes drowsiness, you may have more side effects. Give your health  care provider a list of all medicines you use. Your doctor will tell you how much medicine to take. Do not take more medicine than directed. Call emergency for help if you have problems breathing or unusual sleepiness. Your mouth may get dry. Chewing sugarless gum or sucking hard candy, and drinking plenty of water may help. Contact your doctor if the problem does not go away or is severe. What side effects may I notice from receiving this medicine? Side effects that you should report to your doctor or health care professional as soon as possible:  allergic reactions like skin rash, itching or hives, swelling of the face, lips, or tongue  breathing problems  hallucinations  signs and symptoms of liver injury like dark yellow or brown urine; general ill feeling or flu-like symptoms; light-colored stools; loss of appetite; nausea; right upper quadrant belly pain; unusually weak or tired; yellowing of the eyes or skin  signs and symptoms of low blood pressure like dizziness; feeling faint or lightheaded, falls; unusually weak or tired  unusually slow heartbeat  unusually weak or tired Side effects that usually do not require medical attention (report to your doctor or health care professional if they continue or are bothersome):  blurred vision  constipation  dizziness  dry mouth  tiredness This list may not describe all possible side effects. Call your doctor for medical advice about side effects. You may report side effects to FDA at 1-800-FDA-1088. Where should I keep my medicine? Keep out of the reach of children. Store at room temperature between 15 and 30 degrees C (59 and 86 degrees F). Throw away any unused medicine after the expiration date. NOTE: This sheet is a summary. It may not cover all possible information. If you have questions about this medicine, talk to your doctor, pharmacist, or health care provider.  2020 Elsevier/Gold Standard (2017-03-17 13:33:29)   Thank  you for coming to see [place] at [facility]. I hope we have been able to provide you high quality care today.  You may receive a patient satisfaction survey over the next few weeks. We would appreciate your feedback and comments so that we may continue to improve ourselves and the health of our patients.

## 2019-02-09 ENCOUNTER — Other Ambulatory Visit: Payer: Self-pay | Admitting: Neurology

## 2019-04-08 ENCOUNTER — Other Ambulatory Visit: Payer: Self-pay | Admitting: Neurology

## 2019-06-06 ENCOUNTER — Other Ambulatory Visit: Payer: Self-pay

## 2019-06-06 ENCOUNTER — Encounter (HOSPITAL_COMMUNITY): Payer: Self-pay

## 2019-06-06 ENCOUNTER — Emergency Department (HOSPITAL_COMMUNITY): Payer: 59

## 2019-06-06 ENCOUNTER — Emergency Department (HOSPITAL_COMMUNITY)
Admission: EM | Admit: 2019-06-06 | Discharge: 2019-06-06 | Disposition: A | Discharge status: Home / Self Care | Payer: 59 | Attending: Emergency Medicine | Admitting: Emergency Medicine

## 2019-06-06 DIAGNOSIS — M5136 Other intervertebral disc degeneration, lumbar region: Secondary | ICD-10-CM | POA: Insufficient documentation

## 2019-06-06 DIAGNOSIS — F17210 Nicotine dependence, cigarettes, uncomplicated: Secondary | ICD-10-CM | POA: Diagnosis not present

## 2019-06-06 DIAGNOSIS — M5432 Sciatica, left side: Secondary | ICD-10-CM | POA: Insufficient documentation

## 2019-06-06 DIAGNOSIS — M79605 Pain in left leg: Secondary | ICD-10-CM | POA: Diagnosis present

## 2019-06-06 DIAGNOSIS — Z79899 Other long term (current) drug therapy: Secondary | ICD-10-CM | POA: Insufficient documentation

## 2019-06-06 MED ORDER — KETOROLAC TROMETHAMINE 30 MG/ML IJ SOLN
30.0000 mg | Freq: Once | INTRAMUSCULAR | Status: AC
  Administered 2019-06-06: 30 mg via INTRAMUSCULAR
  Filled 2019-06-06: qty 1

## 2019-06-06 MED ORDER — METHOCARBAMOL 500 MG PO TABS
500.0000 mg | ORAL_TABLET | Freq: Once | ORAL | Status: AC
  Administered 2019-06-06: 500 mg via ORAL
  Filled 2019-06-06: qty 1

## 2019-06-06 MED ORDER — HYDROCODONE-ACETAMINOPHEN 5-325 MG PO TABS: 1.0000 | ORAL_TABLET | ORAL | 0 refills | Status: DC | PRN

## 2019-06-06 MED ORDER — NAPROXEN 500 MG PO TABS: 500.0000 mg | ORAL_TABLET | Freq: Two times a day (BID) | ORAL | 0 refills | Status: DC

## 2019-06-06 MED ORDER — METHOCARBAMOL 750 MG PO TABS: 750.0000 mg | ORAL_TABLET | Freq: Three times a day (TID) | ORAL | 0 refills | Status: DC

## 2019-06-06 MED ORDER — LIDOCAINE 5 % EX PTCH: 1.0000 | MEDICATED_PATCH | CUTANEOUS | 0 refills | Status: DC

## 2019-06-06 NOTE — ED Triage Notes (Signed)
Pt having left leg pain since Friday. Shooting pain down into calf. Ambulatory. Has taken ibuprofen for pain with no relief. Last dose 0430

## 2019-06-06 NOTE — ED Provider Notes (Signed)
[facility] EMERGENCY DEPARTMENT Provider Note   CSN: [id number] Arrival date & time: 06/06/19  0746     History Chief Complaint  Patient presents with  . Leg Pain    [name] is a 53 y.o. female presenting with a 3-day history of left lateral buttock pain which radiates through her lateral thigh and has progressed to now include her upper left calf muscle.  She describes initially a sharp stabbing pain which occurred suddenly as she was walking down a set of steps at the end of her workday 3 days ago.  She works at [employer], denies any injuries.  Her pain is now described as a deep aching sensation which is worsened with movement and ambulation, improves at rest.  She has taken ibuprofen, dose of 400 mg at 4 AM today without improvement.  She uses Voltaren gel for chronic neck pain and applied this to her buttock and thigh yesterday which did improve her symptoms.  She denies fevers or chills, denies dysuria, no rash, has had no urinary or fecal incontinence or retention.  She denies weakness in her leg.  Denies prior history of any low back injuries or pain.  HPI     Past Medical History:  Diagnosis Date  . Adjustment disorder with mixed anxiety and depressed mood   . Bronchitis due to tobacco use   . Cervicalgia   . Cervico-occipital neuralgia   . Headache(784.0)   . Pleuritis   . Pneumonia due to adenovirus     Patient Active Problem List   Diagnosis Date Noted  . 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 Intractable episodic tension-type headache 07/12/2018  . Cervicalgia of occipito-atlanto-axial region 04/15/2018  . Trapezius muscle strain, right, initial encounter 04/15/2018  . Tension-type headache, not intractable 04/15/2018  . UPPER RESPIRATORY INFECTION, ACUTE, WITH BRONCHITIS 10/15/2007  . TOBACCO ABUSE 07/14/2007  . PERIODONTAL DISEASE 07/14/2007    Past Surgical History:  Procedure Laterality Date  . FRACTURE SURGERY    . GANGLION CYST  EXCISION     left hand wrist     OB History   No obstetric history on file.     Family History  Problem Relation Age of Onset  . Hypertension Brother   . Anxiety disorder Sister   . Anxiety disorder Sister     Social History   Tobacco Use  . Smoking status: Current Every Day Smoker    Packs/day: 0.50  . Smokeless tobacco: Never Used  . Tobacco comment: Quit 2016, 03/2018 smoking  Substance Use Topics  . Alcohol use: Yes    Alcohol/week: 6.0 - 8.0 standard drinks    Types: 6 - 8 Glasses of wine per week    Comment: 6-8 glasses weekly, 04/15/18 1 /month  . Drug use: No    Home Medications Prior to Admission medications   Medication Sig Start Date End Date Taking? Authorizing Provider  albuterol (PROVENTIL HFA;VENTOLIN HFA) 108 (90 Base) MCG/ACT inhaler INL 2 PFS PO Q 6 H PRF SOB OR WHZ 03/25/18   [provider]  ALPRAZolam (XANAX) 1 MG tablet Take 0.5-1 mg by mouth every 8 (eight) hours as needed for anxiety.    [provider]  Ernestina Patches 62.5-25 MCG/INH AEPB  01/01/19   [provider]  cyclobenzaprine (FLEXERIL) 10 MG tablet TAKE 1 TABLET BY MOUTH THREE TIMES DAILY EVERY 8 HOURS 04/12/19   Dohmeier, Porfirio Mylar, MD  diclofenac sodium (VOLTAREN) 1 % GEL APPLY 2 TO 3 GRAMS AA  TID PRN P 04/02/18   [provider]  hydrochlorothiazide (HYDRODIURIL) 25 MG tablet TK 1 T PO  QAM 01/01/19   [provider]  methocarbamol (ROBAXIN-750) 750 MG tablet Take 1 tablet (750 mg total) by mouth 3 (three) times daily. 06/06/19   Burgess AmorIdol, Dezyrae Kensinger, PA-C  naproxen (NAPROSYN) 500 MG tablet Take 1 tablet (500 mg total) by mouth 2 (two) times daily. 06/06/19   Burgess AmorIdol, Kaislyn Gulas, PA-C    Allergies    Prednisone  Review of Systems   Review of Systems  Constitutional: Negative for fever.  Respiratory: Negative for shortness of breath.   Cardiovascular: Negative for chest pain and leg swelling.  Gastrointestinal: Negative for abdominal distention, abdominal  pain and constipation.  Genitourinary: Negative for difficulty urinating, dysuria, flank pain, frequency and urgency.  Musculoskeletal: Positive for arthralgias and back pain. Negative for gait problem and joint swelling.  Skin: Negative for rash and wound.  Neurological: Negative for weakness and numbness.    Physical Exam Updated Vital Signs BP (!) 148/76 (BP Location: Left Arm)   Pulse 99   Temp 98.1 F (36.7 C) (Oral)   Resp 12   Ht 5\' 5"  (1.651 m)   Wt 74.8 kg   SpO2 99%   BMI 27.46 kg/m   Physical Exam Vitals and nursing note reviewed.  Constitutional:      Appearance: She is well-developed.  HENT:     Head: Normocephalic.  Eyes:     Conjunctiva/sclera: Conjunctivae normal.  Cardiovascular:     Rate and Rhythm: Normal rate.     Comments: Pedal pulses normal. Pulmonary:     Effort: Pulmonary effort is normal.  Abdominal:     General: Bowel sounds are normal. There is no distension.     Palpations: Abdomen is soft. There is no mass.  Musculoskeletal:        General: Normal range of motion.     Cervical back: Normal range of motion and neck supple.     Lumbar back: No swelling, edema, deformity, spasms, tenderness or bony tenderness.       Back:     Comments: No midline lumbar or thoracic pain.  She is tender to palpation at her left lateral buttock region.  No rash or erythema present.  Negative straight leg raise.  Skin:    General: Skin is warm and dry.  Neurological:     Mental Status: She is alert.     Sensory: No sensory deficit.     Motor: No tremor or atrophy.     Gait: Gait normal.     Deep Tendon Reflexes:     Reflex Scores:      Patellar reflexes are 2+ on the right side and 2+ on the left side.      Achilles reflexes are 2+ on the right side and 2+ on the left side.    Comments: No strength deficit noted in hip and knee flexor and extensor muscle groups.  Ankle flexion and extension intact.     ED Results / Procedures / Treatments   Labs (all  labs ordered are listed, but only abnormal results are displayed) Labs Reviewed - No data to display  EKG None  Radiology DG Lumbar Spine Complete  Result Date: 06/06/2019 CLINICAL DATA:  Acute onset low back pain radiating into the left hip this morning. No known injury. EXAM: LUMBAR SPINE - COMPLETE 4+ VIEW COMPARISON:  None. FINDINGS: Vertebral body height and alignment are maintained. Mild convex left curvature is  seen. There is mild loss of disc space height at L4-5 and L5-S1. Facet arthropathy is also seen at these levels. Paraspinous structures are unremarkable. IMPRESSION: No acute abnormality. Mild lower lumbar degenerative disease. Mild convex left scoliosis. Electronically Signed   By: Inge Rise M.D.   On: 06/06/2019 09:01   DG Hip Unilat W or Wo Pelvis 2-3 Views Left  Result Date: 06/06/2019 CLINICAL DATA:  Acute onset low back pain radiating into the left hip this morning. No known injury. EXAM: DG HIP (WITH OR WITHOUT PELVIS) 2-3V LEFT COMPARISON:  None. FINDINGS: There is no acute bony or joint abnormality. Hip joint spaces are preserved. No focal bony lesion or evidence of avascular necrosis of the femoral heads. Soft tissues appear normal. IMPRESSION: Negative exam. Electronically Signed   By: Inge Rise M.D.   On: 06/06/2019 08:59    Procedures Procedures (including critical care time)  Medications Ordered in ED Medications  methocarbamol (ROBAXIN) tablet 500 mg (500 mg Oral Given 06/06/19 0859)  ketorolac (TORADOL) 30 MG/ML injection 30 mg (30 mg Intramuscular Given 06/06/19 0859)    ED Course  I have reviewed the triage vital signs and the nursing notes.  Pertinent labs & imaging results that were available during my care of the patient were reviewed by me and considered in my medical decision making (see chart for details).    MDM Rules/Calculators/A&P                      No neuro deficit on exam or by history to suggest emergent or surgical  presentation.  Also discussed worsened sx that should prompt immediate re-evaluation including distal weakness, bowel/bladder retention/incontinence.  Imaging reviewed and interpreted by me.  Pt with ddd L4-S1 with arthropathy at these levels.  Suspect this as source of sciatica.  No neuro deficits on exam.  Discussed home tx, return precautions. Plan f/u with pcp as needed.        Final Clinical Impression(s) / ED Diagnoses Final diagnoses:  Sciatica of left side  DDD (degenerative disc disease), lumbar    Rx / DC Orders ED Discharge Orders         Ordered    methocarbamol (ROBAXIN-750) 750 MG tablet  3 times daily     06/06/19 0916    naproxen (NAPROSYN) 500 MG tablet  2 times daily     06/06/19 0916           Evalee Jefferson, PA-C 06/06/19 5625    Long, Wonda Olds, MD 06/06/19 [phone]

## 2019-06-06 NOTE — Discharge Instructions (Addendum)
Take the naproxen prescribed in place of the ibuprofen you have tried.  Continue using the robaxin to help relieve deep muscle spasm.   Avoid lifting,  Bending,  Twisting or any other activity that worsens your pain over the next week.  Apply a heating pad to your buttock area where the pain originates for 20 minutes 3-4 times daily.   You should get rechecked if your symptoms are not improving over the next 5 days, or you develop increased pain,  Weakness in your leg(s) or loss of bladder or bowel function - these are symptoms of a worsening condition.  Your xrays suggests that you do have some mild disk height loss at your L4-L5 level in your lumbar spine which may be the source of your symptom as this may be the source of your irritated sciatic nerve.

## 2019-06-06 NOTE — ED Notes (Signed)
Pt leaving with husband driving

## 2019-06-06 NOTE — ED Notes (Signed)
Pt to xray

## 2019-08-01 ENCOUNTER — Telehealth: Payer: Self-pay | Admitting: Adult Health

## 2019-08-01 NOTE — Telephone Encounter (Signed)
Thank you :)

## 2019-08-01 NOTE — Telephone Encounter (Signed)
Pt called back and accepted the appt on the 17th

## 2019-08-01 NOTE — Telephone Encounter (Signed)
I called patient and LVM to advise that MM is out of office on 2/16 PM for patient's appointment. Requested patient call back to schedule. I have held a spot on 2/17 at 3pm for patient if she calls back.

## 2019-08-02 ENCOUNTER — Ambulatory Visit: Payer: 59 | Admitting: Adult Health

## 2019-08-03 ENCOUNTER — Other Ambulatory Visit: Payer: Self-pay

## 2019-08-03 ENCOUNTER — Encounter: Payer: Self-pay | Admitting: Adult Health

## 2019-08-03 ENCOUNTER — Ambulatory Visit: Payer: 59 | Admitting: Adult Health

## 2019-08-03 VITALS — BP 131/83 | HR 76 | Temp 97.7°F | Ht 65.5 in | Wt 182.4 lb

## 2019-08-03 DIAGNOSIS — R200 Anesthesia of skin: Secondary | ICD-10-CM | POA: Diagnosis not present

## 2019-08-03 DIAGNOSIS — G44209 Tension-type headache, unspecified, not intractable: Secondary | ICD-10-CM | POA: Diagnosis not present

## 2019-08-03 MED ORDER — TIZANIDINE HCL 2 MG PO CAPS: 2.0000 mg | ORAL_CAPSULE | Freq: Every day | ORAL | 5 refills | Status: DC

## 2019-08-03 NOTE — Patient Instructions (Signed)
Your Plan:  Start tizanidine 2 mg at bedtime If your symptoms worsen or you develop new symptoms please let us know.   Thank you for coming to see [place] at [facility]. I hope we have been able to provide you high quality care today.  You may receive a patient satisfaction survey over the next few weeks. We would appreciate your feedback and comments so that we may continue to improve ourselves and the health of our patients.  Tizanidine tablets or capsules What is this medicine? TIZANIDINE (tye ZAN i deen) helps to relieve muscle spasms. It may be used to help in the treatment of multiple sclerosis and spinal cord injury. This medicine may be used for other purposes; ask your health care provider or pharmacist if you have questions. COMMON BRAND NAME(S): Zanaflex What should I tell my health care provider before I take this medicine? They need to know if you have any of these conditions:  kidney disease  liver disease  low blood pressure  mental disorder  an unusual or allergic reaction to tizanidine, other medicines, lactose (tablets only), foods, dyes, or preservatives  pregnant or trying to get pregnant  breast-feeding How should I use this medicine? Take this medicine by mouth with a full glass of water. Take this medicine on an empty stomach, at least 30 minutes before or 2 hours after food. Do not take with food unless you talk with your doctor. Follow the directions on the prescription label. Take your medicine at regular intervals. Do not take your medicine more often than directed. Do not stop taking except on your doctor's advice. Suddenly stopping the medicine can be very dangerous. Talk to your pediatrician regarding the use of this medicine in children. Patients over 63 years old may have a stronger reaction and need a smaller dose. Overdosage: If you think you have taken too much of this medicine contact a poison control center or emergency room at  once. NOTE: This medicine is only for you. Do not share this medicine with others. What if I miss a dose? If you miss a dose, take it as soon as you can. If it is almost time for your next dose, take only that dose. Do not take double or extra doses. What may interact with this medicine? Do not take this medicine with any of the following medications:  ciprofloxacin  fluvoxamine  narcotic medicines for cough  thiabendazole This medicine may also interact with the following medications:  acyclovir  alcohol  antihistamines for allergy, cough, and cold  baclofen  certain medicines for anxiety or sleep  certain medicines for blood pressure, heart disease, irregular heartbeat  certain medicines for depression like amitriptyline, fluoxetine, sertraline  certain medicines for seizures like phenobarbital, primidone  certain medicines for stomach problems like cimetidine, famotidine  female hormones, like estrogens or progestins and birth control pills, patches, rings, or injections  general anesthetics like halothane, isoflurane, methoxyflurane, propofol  local anesthetics like lidocaine, pramoxine, tetracaine  medicines that relax muscles for surgery  narcotic medicines for pain  phenothiazines like chlorpromazine, mesoridazine, prochlorperazine  ticlopidine  zileuton This list may not describe all possible interactions. Give your health care provider a list of all the medicines, herbs, non-prescription drugs, or dietary supplements you use. Also tell them if you smoke, drink alcohol, or use illegal drugs. Some items may interact with your medicine. What should I watch for while using this medicine? Tell your doctor or health care professional if your symptoms do  not start to get better or if they get worse. You may get drowsy or dizzy. Do not drive, use machinery, or do anything that needs mental alertness until you know how this medicine affects you. Do not stand or sit  up quickly, especially if you are an older patient. This reduces the risk of dizzy or fainting spells. Alcohol may interfere with the effect of this medicine. Avoid alcoholic drinks. If you are taking another medicine that also causes drowsiness, you may have more side effects. Give your health care provider a list of all medicines you use. Your doctor will tell you how much medicine to take. Do not take more medicine than directed. Call emergency for help if you have problems breathing or unusual sleepiness. Your mouth may get dry. Chewing sugarless gum or sucking hard candy, and drinking plenty of water may help. Contact your doctor if the problem does not go away or is severe. What side effects may I notice from receiving this medicine? Side effects that you should report to your doctor or health care professional as soon as possible:  allergic reactions like skin rash, itching or hives, swelling of the face, lips, or tongue  breathing problems  hallucinations  signs and symptoms of liver injury like dark yellow or brown urine; general ill feeling or flu-like symptoms; light-colored stools; loss of appetite; nausea; right upper quadrant belly pain; unusually weak or tired; yellowing of the eyes or skin  signs and symptoms of low blood pressure like dizziness; feeling faint or lightheaded, falls; unusually weak or tired  unusually slow heartbeat  unusually weak or tired Side effects that usually do not require medical attention (report to your doctor or health care professional if they continue or are bothersome):  blurred vision  constipation  dizziness  dry mouth  tiredness This list may not describe all possible side effects. Call your doctor for medical advice about side effects. You may report side effects to FDA at 1-800-FDA-1088. Where should I keep my medicine? Keep out of the reach of children. Store at room temperature between 15 and 30 degrees C (59 and 86 degrees F).  Throw away any unused medicine after the expiration date. NOTE: This sheet is a summary. It may not cover all possible information. If you have questions about this medicine, talk to your doctor, pharmacist, or health care provider.  2020 Elsevier/Gold Standard (2017-03-17 13:33:29)

## 2019-08-03 NOTE — Progress Notes (Signed)
PATIENT: [name] DOB: April 12, 1966  REASON FOR VISIT: follow up HISTORY FROM: patient  HISTORY OF PRESENT ILLNESS: Today 08/03/19:  [name] is a 54 year old female with a history of tension type headaches.  She returns today for follow-up.  She states that she stopped taking Flexeril.  She reports that she has approximately 1-2 headaches a week.  She works in front of a computer all day.  She states that she feels tightness in her neck muscles bilaterally.  She states the headache comes up the back of the head and feels like a squeezing sensation.  She denies light or noise sensitivity.  She also states that in December she fell going down her steps and landed on her left shin.  She did go to the emergency room and they completed x-rays of the lumbar spine that was relatively unremarkable.  She saw her PCP who gave her pain medicine and referred her to physical therapy.  She states that she continues to have numbness on the anterior portion of the left shin.  Denies any significant changes with her gait or balance.  She returns today for an evaluation.  HISTORY 01/18/19:  [name] is a 54 year old female with a history of tension type headaches.  She returns today for follow-up.  She states that she has a least 1-2 headaches a month.  She states that typically starts in the right side of the neck and radiates up the back of the head.  She states that sometimes her headaches are in the center of her forehead.  She reports photophobia and phonophobia but denies nausea and vomiting.  She states that she typically will take 800 mg of ibuprofen typically it is not beneficial and she will take a BC powder.  She states putting heat or Voltaren gel on her neck is also helpful.  In the past she reports that she is been referred to physical therapy but due to cost she did not go.  She continues to take Flexeril at bedtime but unsure how much is beneficial.  REVIEW OF SYSTEMS: Out of a complete 14  system review of symptoms, the patient complains only of the following symptoms, and all other reviewed systems are negative.  See HPI  ALLERGIES: Allergies  Allergen Reactions  . Prednisone     Migraines     HOME MEDICATIONS: Outpatient Medications Prior to Visit  Medication Sig Dispense Refill  . albuterol (PROVENTIL HFA;VENTOLIN HFA) 108 (90 Base) MCG/ACT inhaler INL 2 PFS PO Q 6 H PRF SOB OR WHZ  1  . ALPRAZolam (XANAX) 1 MG tablet Take 0.5-1 mg by mouth every 8 (eight) hours as needed for anxiety.    Ailene Ards ELLIPTA 62.5-25 MCG/INH AEPB     . atorvastatin (LIPITOR) 10 MG tablet Take 10 mg by mouth at bedtime.    . diclofenac sodium (VOLTAREN) 1 % GEL APPLY 2 TO 3 GRAMS AA TID PRN P  0  . esomeprazole (NEXIUM) 40 MG capsule Take 40 mg by mouth daily.    . hydrochlorothiazide (HYDRODIURIL) 25 MG tablet TK 1 T PO  QAM    . levocetirizine (XYZAL) 5 MG tablet Take 5 mg by mouth daily.    Marland Kitchen lidocaine (LIDODERM) 5 % Place 1 patch onto the skin daily. Remove & Discard patch within 12 hours or as directed by MD 30 patch 0  . naproxen (NAPROSYN) 500 MG tablet Take 1 tablet (500 mg total) by mouth 2 (two) times daily. 20  tablet 0  . cyclobenzaprine (FLEXERIL) 10 MG tablet TAKE 1 TABLET BY MOUTH THREE TIMES DAILY EVERY 8 HOURS (Patient not taking: Reported on 08/03/2019) 90 tablet 1  . HYDROcodone-acetaminophen (NORCO/VICODIN) 5-325 MG tablet Take 1 tablet by mouth every 4 (four) hours as needed. 10 tablet 0  . methocarbamol (ROBAXIN-750) 750 MG tablet Take 1 tablet (750 mg total) by mouth 3 (three) times daily. 15 tablet 0   Facility-Administered Medications Prior to Visit  Medication Dose Route Frequency Provider Last Rate Last Admin  . betamethasone acetate-betamethasone sodium phosphate (CELESTONE) injection 12 mg  12 mg Intramuscular Once Dohmeier, Asencion Partridge, MD        PAST MEDICAL HISTORY: Past Medical History:  Diagnosis Date  . Adjustment disorder with mixed anxiety and depressed  mood   . Bronchitis due to tobacco use   . Cervicalgia   . Cervico-occipital neuralgia   . Headache(784.0)   . Pleuritis   . Pneumonia due to adenovirus     PAST SURGICAL HISTORY: Past Surgical History:  Procedure Laterality Date  . FRACTURE SURGERY    . GANGLION CYST EXCISION     left hand wrist    FAMILY HISTORY: Family History  Problem Relation Age of Onset  . Hypertension Brother   . Anxiety disorder Sister   . Anxiety disorder Sister     SOCIAL HISTORY: Social History   Socioeconomic History  . Marital status: Married    Spouse name: Not on file  . Number of children: 0  . Years of education: 11 th  . Highest education level: Not on file  Occupational History    Comment: [employer]  Tobacco Use  . Smoking status: Current Every Day Smoker    Packs/day: 0.50  . Smokeless tobacco: Never Used  . Tobacco comment: Quit 2016, 03/2018 smoking  Substance and Sexual Activity  . Alcohol use: Yes    Alcohol/week: 6.0 - 8.0 standard drinks    Types: 6 - 8 Glasses of wine per week    Comment: 6-8 glasses weekly, 04/15/18 1 /month  . Drug use: No  . Sexual activity: Yes    Birth control/protection: Injection  Other Topics Concern  . Not on file  Social History Narrative   Patient lives at home with her husband [name]). 04/15/18 separated      Patient is unemployed. Works part time at a Pharmacist, community.   Education 12 th    Right handed.   Caffeine two cups of coffee daily.   Social Determinants of Health   Financial Resource Strain:   . Difficulty of Paying Living Expenses: Not on file  Food Insecurity:   . Worried About Charity fundraiser in the Last Year: Not on file  . Ran Out of Food in the Last Year: Not on file  Transportation Needs:   . Lack of Transportation (Medical): Not on file  . Lack of Transportation (Non-Medical): Not on file  Physical Activity:   . Days of Exercise per Week: Not on file  . Minutes of Exercise per Session: Not on file    Stress:   . Feeling of Stress : Not on file  Social Connections:   . Frequency of Communication with Friends and Family: Not on file  . Frequency of Social Gatherings with Friends and Family: Not on file  . Attends Religious Services: Not on file  . Active Member of Clubs or Organizations: Not on file  . Attends Archivist Meetings: Not on file  .  Marital Status: Not on file  Intimate Partner Violence:   . Fear of Current or Ex-Partner: Not on file  . Emotionally Abused: Not on file  . Physically Abused: Not on file  . Sexually Abused: Not on file      PHYSICAL EXAM  Vitals:   08/03/19 1447  BP: 131/83  Pulse: 76  Temp: 97.7 F (36.5 C)  TempSrc: Oral  Weight: 182 lb 6.4 oz (82.7 kg)  Height: 5' 5.5" (1.664 m)   Body mass index is 29.89 kg/m.  Generalized: Well developed, in no acute distress   Neurological examination  Mentation: Alert oriented to time, place, history taking. Follows all commands speech and language fluent Cranial nerve II-XII: Pupils were equal round reactive to light. Extraocular movements were full, visual field were full on confrontational test. Facial sensation and strength were normal. Uvula tongue midline. Head turning and shoulder shrug  were normal and symmetric. Motor: The motor testing reveals 5 over 5 strength of all 4 extremities. Good symmetric motor tone is noted throughout.  Sensory: Sensory testing is intact to soft touch on all 4 extremities.  Pinprick sensation decreased in the left lower extremity on the anterior portion of the lower leg.  This extends up to the knee.  Pinprick sensation is intact on the posterior portion of the lower extremity. Coordination: Cerebellar testing reveals good finger-nose-finger and heel-to-shin bilaterally.  Gait and station: Gait is normal.  Reflexes: Deep tendon reflexes are symmetric and normal bilaterally.   DIAGNOSTIC DATA (LABS, IMAGING, TESTING) - I reviewed patient records, labs,  notes, testing and imaging myself where available.  Lab Results  Component Value Date   WBC 15.1 (H) 12/06/2012   HGB 15.7 (H) 12/06/2012   HCT 43.8 12/06/2012   MCV 96.5 12/06/2012   PLT 357 12/06/2012      Component Value Date/Time   NA 137 12/06/2012 2200   K 3.7 12/06/2012 2200   CL 98 12/06/2012 2200   CO2 24 12/06/2012 2200   GLUCOSE 121 (H) 12/06/2012 2200   BUN 5 (L) 12/06/2012 2200   CREATININE 0.71 12/06/2012 2200   CALCIUM 9.9 12/06/2012 2200   PROT 5.8 (L) 02/28/2008 1658   ALBUMIN 3.6 02/28/2008 1658   AST 30 02/28/2008 1658   ALT 11 02/28/2008 1658   ALKPHOS 69 02/28/2008 1658   BILITOT 1.4 (H) 02/28/2008 1658   GFRNONAA >90 12/06/2012 2200   GFRAA >90 12/06/2012 2200   Lab Results  Component Value Date   CHOL 212 (H) 07/14/2007   HDL 49 07/14/2007   LDLCALC 137 (H) 07/14/2007   TRIG 128 07/14/2007   CHOLHDL 4.3 Ratio 07/14/2007     ASSESSMENT AND PLAN 54 y.o. year old female  has a past medical history of Adjustment disorder with mixed anxiety and depressed mood, Bronchitis due to tobacco use, Cervicalgia, Cervico-occipital neuralgia, Headache(784.0), Pleuritis, and Pneumonia due to adenovirus. here with :  1.  Tension type headache  -Start tizanidine 2 mg at bedtime -She has received trigger point injections in the past but only got 1-2 weeks of benefit.  Advised patient I am not sure that we should continue with trigger point injections -Discussed neuromuscular therapy but she was in physical therapy recently and did not find it beneficial  2.  Numbness left lower extremity-anterior shin  -Due to trauma from fall.  Discussed with Dr. Lucia Gaskins who did not feel nerve conduction studies were needed at this time.  Numbness sensation may get better over time.  Certainly  if symptoms worsen she can let us or her PCP know.     Butch Penny, MSN, NP-C 08/03/2019, 3:48 PM [facility] [address] [place], [place]  [address] [phone]

## 2019-08-15 ENCOUNTER — Encounter: Payer: Self-pay | Admitting: Adult Health

## 2019-09-01 ENCOUNTER — Encounter: Payer: Self-pay | Admitting: Adult Health

## 2019-09-29 ENCOUNTER — Other Ambulatory Visit: Payer: Self-pay

## 2019-09-29 ENCOUNTER — Ambulatory Visit: Payer: 59 | Admitting: Obstetrics & Gynecology

## 2019-09-29 ENCOUNTER — Encounter: Payer: Self-pay | Admitting: Obstetrics & Gynecology

## 2019-09-29 VITALS — BP 133/80 | HR 68 | Ht 65.5 in | Wt 173.0 lb

## 2019-09-29 DIAGNOSIS — N907 Vulvar cyst: Secondary | ICD-10-CM

## 2019-09-29 DIAGNOSIS — N950 Postmenopausal bleeding: Secondary | ICD-10-CM

## 2019-09-29 DIAGNOSIS — R87618 Other abnormal cytological findings on specimens from cervix uteri: Secondary | ICD-10-CM

## 2019-09-29 NOTE — Progress Notes (Signed)
Chief Complaint  Patient presents with  . Vaginal Bleeding    also has bumps on labia      54 y.o. G0P0000 No LMP recorded. Patient is postmenopausal. The current method of family planning is post menopausal status.  Outpatient Encounter Medications as of 09/29/2019  Medication Sig  . ALPRAZolam (XANAX) 1 MG tablet Take 0.5-1 mg by mouth every 8 (eight) hours as needed for anxiety.  Marland Kitchen atorvastatin (LIPITOR) 10 MG tablet Take 10 mg by mouth at bedtime.  . baclofen (LIORESAL) 10 MG tablet Take 10 mg by mouth 3 (three) times daily.  Marland Kitchen esomeprazole (NEXIUM) 40 MG capsule Take 40 mg by mouth daily.  . hydrochlorothiazide (HYDRODIURIL) 25 MG tablet TK 1 T PO  QAM  . levocetirizine (XYZAL) 5 MG tablet Take 5 mg by mouth daily.  . [DISCONTINUED] albuterol (PROVENTIL HFA;VENTOLIN HFA) 108 (90 Base) MCG/ACT inhaler INL 2 PFS PO Q 6 H PRF SOB OR WHZ  . [DISCONTINUED] ANORO ELLIPTA 62.5-25 MCG/INH AEPB   . [DISCONTINUED] diclofenac sodium (VOLTAREN) 1 % GEL APPLY 2 TO 3 GRAMS AA TID PRN P  . [DISCONTINUED] lidocaine (LIDODERM) 5 % Place 1 patch onto the skin daily. Remove & Discard patch within 12 hours or as directed by MD  . [DISCONTINUED] naproxen (NAPROSYN) 500 MG tablet Take 1 tablet (500 mg total) by mouth 2 (two) times daily.  . [DISCONTINUED] tizanidine (ZANAFLEX) 2 MG capsule Take 1 capsule (2 mg total) by mouth at bedtime.   Facility-Administered Encounter Medications as of 09/29/2019  Medication  . betamethasone acetate-betamethasone sodium phosphate (CELESTONE) injection 12 mg    Subjective Pt is many years post menopausal Had 1 episode of PMB 3+ months ago none since Was seenby PCP in office and told she had some blood in vault as well aslos with bumps on the vulva for about 6 months or so, no pain Has bleeding with intercourse Past Medical History:  Diagnosis Date  . Adjustment disorder with mixed anxiety and depressed mood   . Bronchitis due to tobacco use   .  Cervicalgia   . Cervico-occipital neuralgia   . Headache(784.0)   . Pleuritis   . Pneumonia due to adenovirus     Past Surgical History:  Procedure Laterality Date  . FRACTURE SURGERY    . GANGLION CYST EXCISION     left hand wrist    OB History    Gravida  0   Para  0   Term  0   Preterm  0   AB  0   Living  0     SAB  0   TAB  0   Ectopic  0   Multiple  0   Live Births  0           Allergies  Allergen Reactions  . Prednisone     Migraines     Social History   Socioeconomic History  . Marital status: Married    Spouse name: Not on file  . Number of children: 0  . Years of education: 36 th  . Highest education level: Not on file  Occupational History    Comment: [employer]  Tobacco Use  . Smoking status: Current Every Day Smoker    Packs/day: 0.50  . Smokeless tobacco: Never Used  . Tobacco comment: Quit 2016, 03/2018 smoking  Substance and Sexual Activity  . Alcohol use: Yes    Alcohol/week: 6.0 - 8.0 standard drinks  Types: 6 - 8 Glasses of wine per week    Comment: 6-8 glasses weekly, 04/15/18 1 /month  . Drug use: No  . Sexual activity: Yes    Birth control/protection: Post-menopausal  Other Topics Concern  . Not on file  Social History Narrative   Patient lives at home with her husband [name]). 04/15/18 separated      Patient is unemployed. Works part time at a Sports administrator.   Education 12 th    Right handed.   Caffeine two cups of coffee daily.   Social Determinants of Health   Financial Resource Strain: Low Risk   . Difficulty of Paying Living Expenses: Not very hard  Food Insecurity: No Food Insecurity  . Worried About Programme researcher, broadcasting/film/video in the Last Year: Never true  . Ran Out of Food in the Last Year: Never true  Transportation Needs: No Transportation Needs  . Lack of Transportation (Medical): No  . Lack of Transportation (Non-Medical): No  Physical Activity: Insufficiently Active  . Days of Exercise per  Week: 2 days  . Minutes of Exercise per Session: 60 min  Stress: Stress Concern Present  . Feeling of Stress : To some extent  Social Connections: Slightly Isolated  . Frequency of Communication with Friends and Family: More than three times a week  . Frequency of Social Gatherings with Friends and Family: More than three times a week  . Attends Religious Services: More than 4 times per year  . Active Member of Clubs or Organizations: No  . Attends Banker Meetings: Never  . Marital Status: Married    Family History  Problem Relation Age of Onset  . Hypertension Brother   . Anxiety disorder Sister   . Anxiety disorder Sister     Medications:       Current Outpatient Medications:  .  ALPRAZolam (XANAX) 1 MG tablet, Take 0.5-1 mg by mouth every 8 (eight) hours as needed for anxiety., Disp: , Rfl:  .  atorvastatin (LIPITOR) 10 MG tablet, Take 10 mg by mouth at bedtime., Disp: , Rfl:  .  baclofen (LIORESAL) 10 MG tablet, Take 10 mg by mouth 3 (three) times daily., Disp: , Rfl:  .  esomeprazole (NEXIUM) 40 MG capsule, Take 40 mg by mouth daily., Disp: , Rfl:  .  hydrochlorothiazide (HYDRODIURIL) 25 MG tablet, TK 1 T PO  QAM, Disp: , Rfl:  .  levocetirizine (XYZAL) 5 MG tablet, Take 5 mg by mouth daily., Disp: , Rfl:   Current Facility-Administered Medications:  .  betamethasone acetate-betamethasone sodium phosphate (CELESTONE) injection 12 mg, 12 mg, Intramuscular, Once, Dohmeier, Porfirio Mylar, MD  Objective Blood pressure 133/80, pulse 68, height 5' 5.5" (1.664 m), weight 173 lb (78.5 kg).  General WDWN female NAD Vulva:  normal appearing vulva with no masses, tenderness small multiple bilateral sebaceous cysts  Vagina:  normal mucosa, no discharge Cervix:  Normal no lesions Uterus:  normal size, contour, position, consistency, mobility, non-tender Adnexa: ovaries:present,  normal adnexa in size, nontender and no masses   Pertinent ROS No burning with urination,  frequency or urgency No nausea, vomiting or diarrhea Nor fever chills or other constitutional symptoms   Labs or studies pending    Impression Diagnoses this Encounter::   ICD-10-CM   1. PMB (postmenopausal bleeding)  N95.0 US PELVIS (TRANSABDOMINAL ONLY)    US PELVIS TRANSVAGINAL NON-OB (TV ONLY)   1 episode 3+ months ago, none since, many years since any menses  2. Sebaceous cyst  of labia, multiple, bilateral  N90.7    Pt desires removal, appropriate for office procedure,   3. Abnormal Pap, by patient report, will obtain Pap results from PCP  R87.618     Established relevant diagnosis(es):   Plan/Recommendations: No orders of the defined types were placed in this encounter.   Labs or Scans Ordered: Orders Placed This Encounter  Procedures  . US PELVIS (TRANSABDOMINAL ONLY)  . US PELVIS TRANSVAGINAL NON-OB (TV ONLY)    Management:: See diagnoses above  Follow up Return for GYN sono, removal of vulvar sebaceous cysts, please give 40 minutes for this appointment post sono.        All questions were answered.

## 2019-10-03 ENCOUNTER — Encounter: Payer: Self-pay | Admitting: Obstetrics & Gynecology

## 2019-10-03 ENCOUNTER — Ambulatory Visit: Payer: 59 | Admitting: Obstetrics & Gynecology

## 2019-10-03 ENCOUNTER — Ambulatory Visit (INDEPENDENT_AMBULATORY_CARE_PROVIDER_SITE_OTHER): Payer: 59

## 2019-10-03 ENCOUNTER — Other Ambulatory Visit: Payer: Self-pay | Admitting: Obstetrics & Gynecology

## 2019-10-03 ENCOUNTER — Other Ambulatory Visit: Payer: Self-pay

## 2019-10-03 VITALS — BP 132/86 | HR 71 | Ht 64.5 in | Wt 175.0 lb

## 2019-10-03 DIAGNOSIS — N907 Vulvar cyst: Secondary | ICD-10-CM | POA: Diagnosis not present

## 2019-10-03 DIAGNOSIS — N950 Postmenopausal bleeding: Secondary | ICD-10-CM

## 2019-10-03 DIAGNOSIS — N83292 Other ovarian cyst, left side: Secondary | ICD-10-CM | POA: Diagnosis not present

## 2019-10-03 MED ORDER — HYDROCODONE-ACETAMINOPHEN 5-325 MG PO TABS: 1.0000 | ORAL_TABLET | Freq: Four times a day (QID) | ORAL | 0 refills | Status: DC | PRN

## 2019-10-03 NOTE — Progress Notes (Signed)
PELVIC US TA/TV:homogeneous retroflexed subseptate uterus,mid right pedunculated fibroid 1.3 x 1.5 x 1.1,EEC 2.9 mm,normal right ovary,simple left ovarian cyst 2 x 1.3 x 1.6 cm,no free fluid,ovaries appear mobile,no pain during ultrasound

## 2019-10-04 ENCOUNTER — Telehealth: Payer: Self-pay | Admitting: Obstetrics & Gynecology

## 2019-10-04 NOTE — Telephone Encounter (Signed)
Telephoned patient at home number and states did fill the Lortab. Patient is getting relief from the medication.

## 2019-10-04 NOTE — Telephone Encounter (Signed)
Did she fill her lortab I sent in for her

## 2019-10-04 NOTE — Telephone Encounter (Signed)
Patient is in horrible pain.  She is wanting to know if there is something over the counter she can spray on it to numb that area.  [facility] [place]  [phone]

## 2019-10-04 NOTE — Telephone Encounter (Signed)
Ummmm...Marland KitchenMarland Kitchenthat's why I prescribed it....yesterday

## 2019-10-13 ENCOUNTER — Other Ambulatory Visit: Payer: Self-pay

## 2019-10-13 ENCOUNTER — Ambulatory Visit (INDEPENDENT_AMBULATORY_CARE_PROVIDER_SITE_OTHER): Payer: 59 | Admitting: Obstetrics & Gynecology

## 2019-10-13 ENCOUNTER — Other Ambulatory Visit: Payer: Self-pay | Admitting: Obstetrics & Gynecology

## 2019-10-13 ENCOUNTER — Encounter: Payer: Self-pay | Admitting: Obstetrics & Gynecology

## 2019-10-13 VITALS — BP 123/79 | HR 76 | Ht 65.0 in | Wt 176.0 lb

## 2019-10-13 DIAGNOSIS — Z4802 Encounter for removal of sutures: Secondary | ICD-10-CM

## 2019-10-13 MED ORDER — METRONIDAZOLE 0.75 % VA GEL: VAGINAL | 1 refills | Status: DC

## 2019-10-26 IMAGING — DX DG RIBS BILAT 3V
6 series · 6 of 6 positions shown · non-contrast
Comparison: Chest radiographs 02/08/2018

CLINICAL DATA: Left greater than right rib pain after fainting and
falling. Shortness of breath.

EXAM:
BILATERAL RIBS - 3+ VIEW

[rib pa obl (1 of 3)]
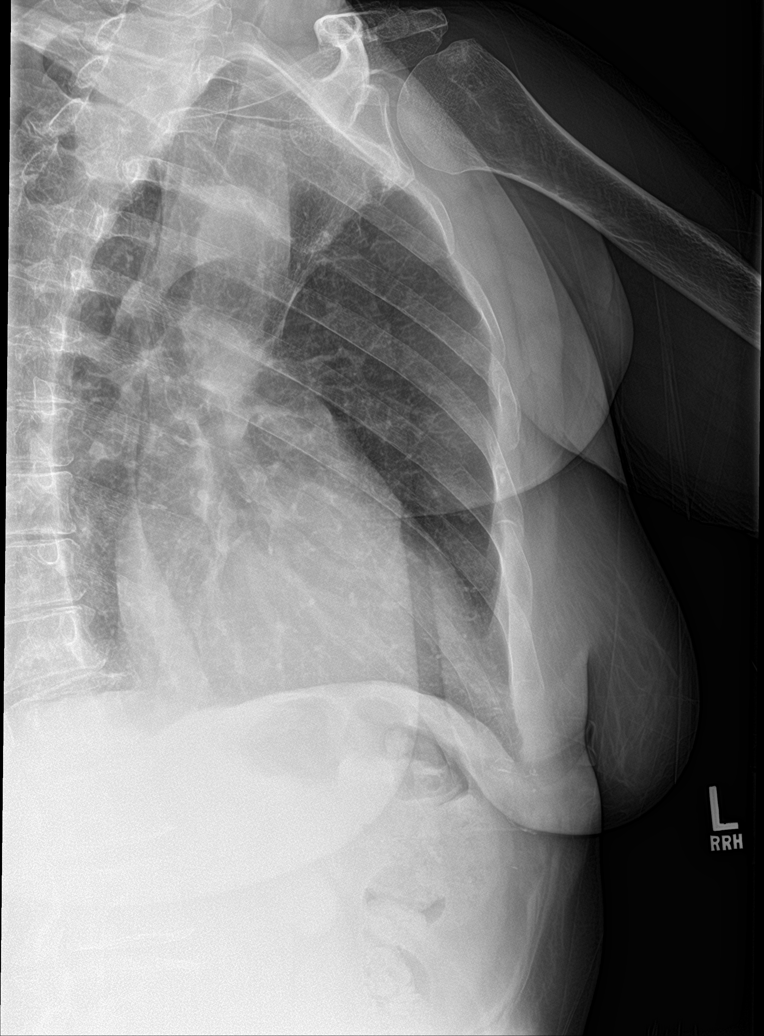

[rib ap (1 of 3)]
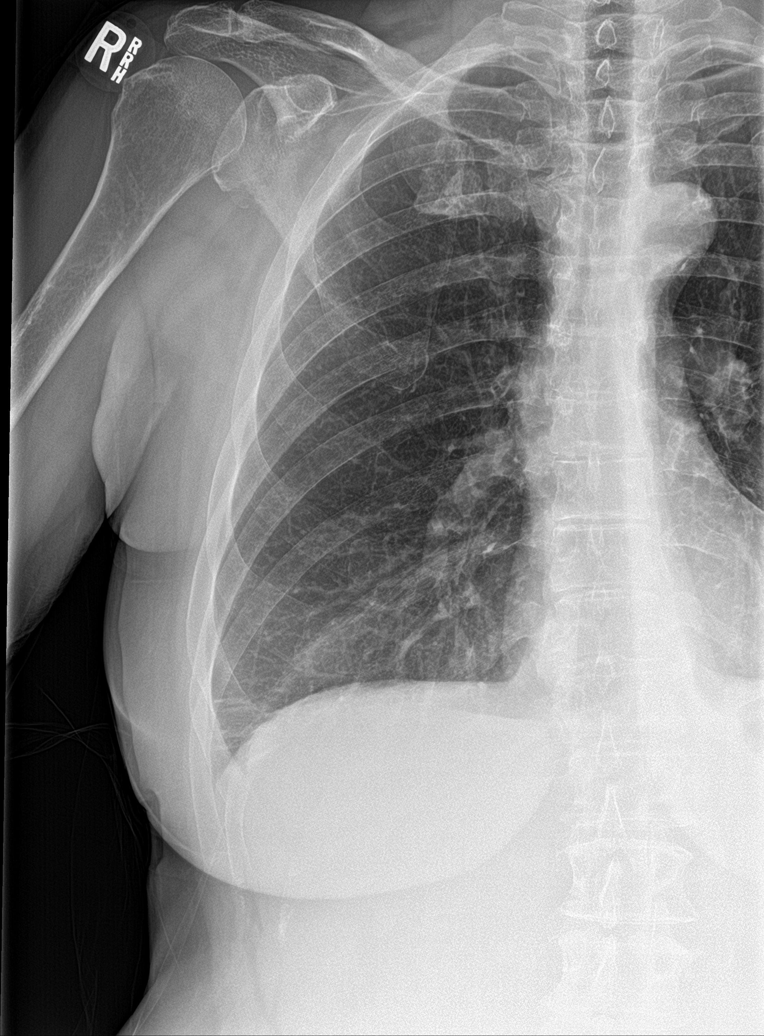

[rib ap (2 of 3)]
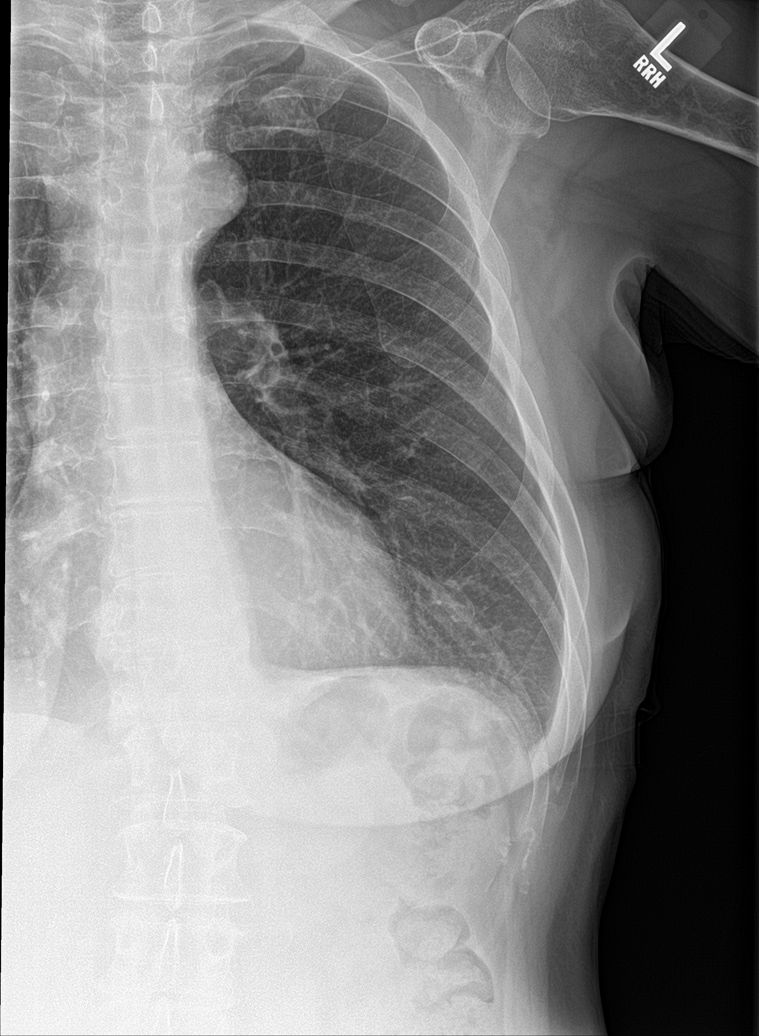

[rib pa obl (2 of 3)]
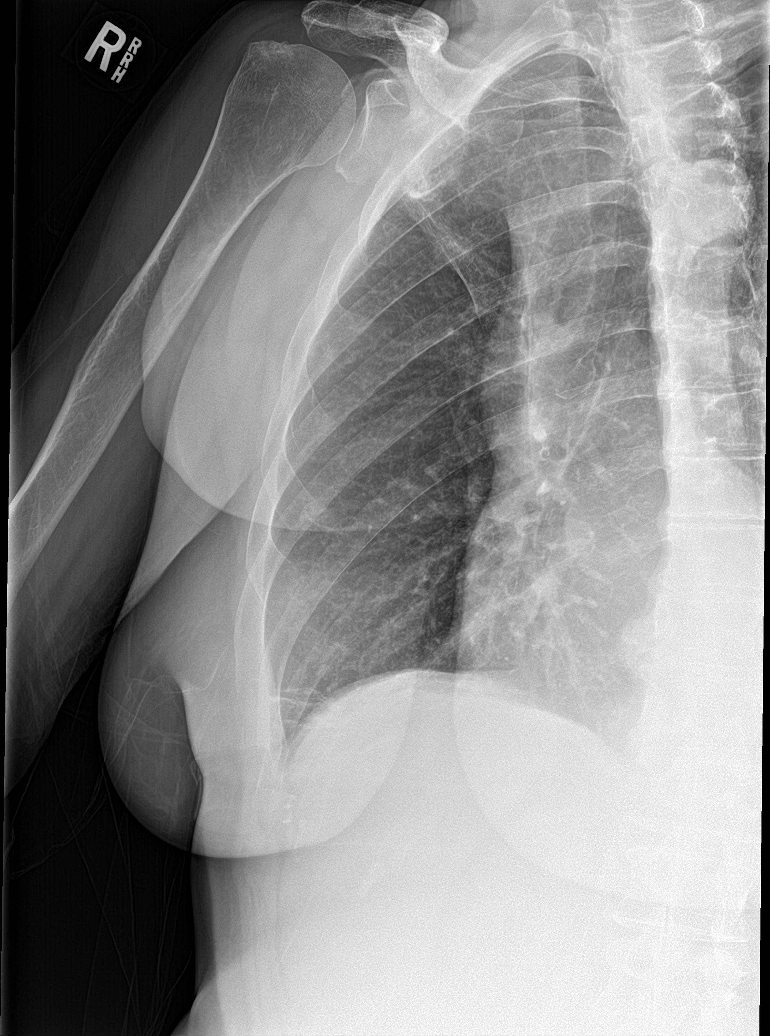

[rib pa obl (3 of 3)]
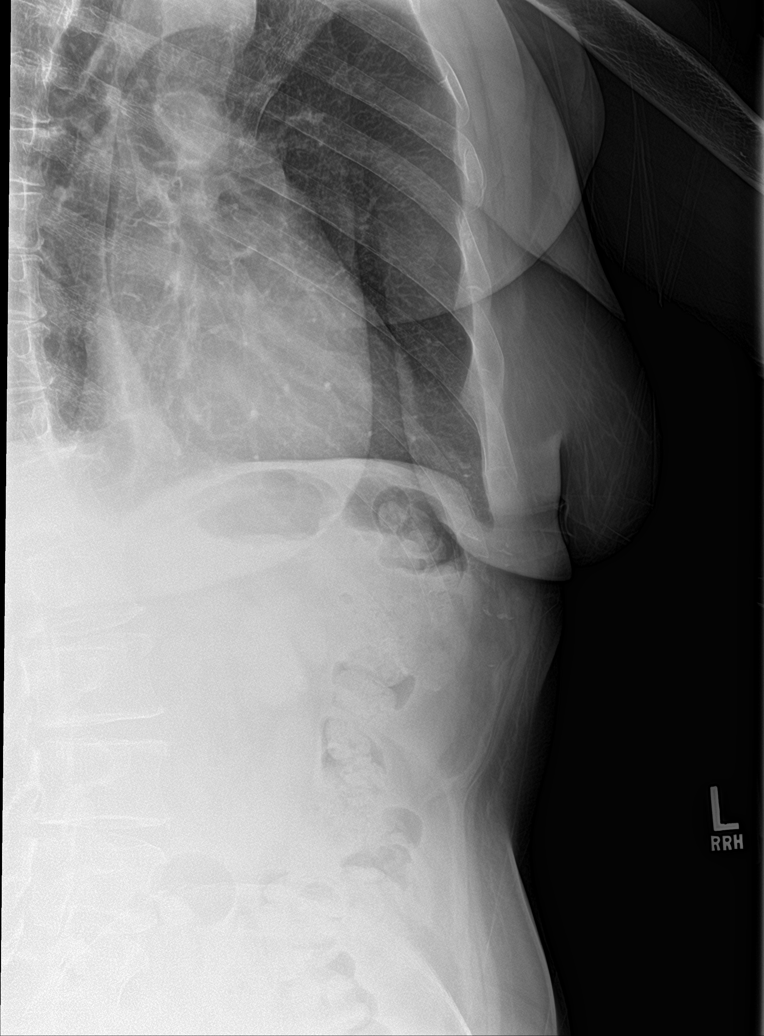

[rib ap (3 of 3)]
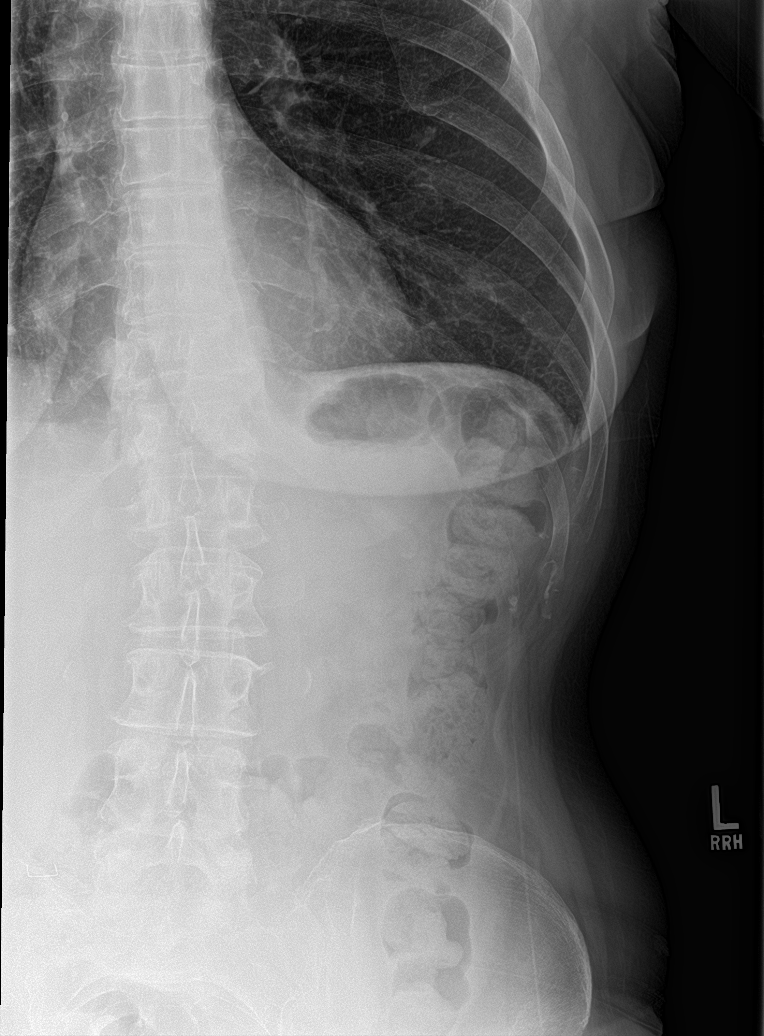

[6 of 6 positions shown; findings below may reference images not displayed]

FINDINGS: No rib fracture is identified. There is minimal atelectasis
laterally in the right lung base. Mild thoracolumbar levoscoliosis
is noted.
IMPRESSION: No rib fracture identified.

## 2019-10-30 ENCOUNTER — Encounter: Payer: Self-pay | Admitting: Obstetrics & Gynecology

## 2019-10-30 NOTE — Progress Notes (Signed)
Patient had bilateral epidermal inclusion cyst multiple on each vulva She had an office-based partial vulvectomy for removal of the epidermal inclusion cyst or sebaceous cyst Pathology returned as benign  There are multiple sutures on each side for wound closure Both wounds appear to be healing well with no evidence of infection There is still some dependent bruising All sutures were removed without difficulty Local care instructions were given  Follow-up as needed

## 2019-10-30 NOTE — Progress Notes (Signed)
Patient is in for removal of bilateral sebaceous cyst of the vulva She probably has 10 or 12 on each side They are centered in the lower vulva and are amenable for office removal  The areas prepped with Betadine 30 cc of 1% lidocaine is injected and allowed to set up An 11 blade is used and wide local excision is performed bilaterally This essentially is a partial vulvectomy bilaterally The specimen sent the pathology for evaluation A deep layer sutures placed bilaterally Superficial layer is then placed as well using 3-0 Ethilon Multiple interrupted sutures were placed approximately 6 on either side There is good return of anatomy There is good hemostasis Patient is given local care instructions She will be seen back in approximately 9 to 10 days for suture removal She is given a prescription for Lortab for pain

## 2019-12-12 ENCOUNTER — Ambulatory Visit: Payer: 59 | Admitting: Adult Health

## 2019-12-26 ENCOUNTER — Other Ambulatory Visit: Payer: Self-pay

## 2019-12-26 ENCOUNTER — Emergency Department (HOSPITAL_COMMUNITY)
Admission: EM | Admit: 2019-12-26 | Discharge: 2019-12-26 | Disposition: A | Discharge status: Home / Self Care | Payer: 59 | Attending: Emergency Medicine | Admitting: Emergency Medicine

## 2019-12-26 ENCOUNTER — Encounter (HOSPITAL_COMMUNITY): Payer: Self-pay | Admitting: Emergency Medicine

## 2019-12-26 ENCOUNTER — Emergency Department (HOSPITAL_COMMUNITY): Payer: 59

## 2019-12-26 DIAGNOSIS — R0789 Other chest pain: Secondary | ICD-10-CM | POA: Diagnosis not present

## 2019-12-26 DIAGNOSIS — Z5321 Procedure and treatment not carried out due to patient leaving prior to being seen by health care provider: Secondary | ICD-10-CM | POA: Insufficient documentation

## 2019-12-26 DIAGNOSIS — R519 Headache, unspecified: Secondary | ICD-10-CM | POA: Diagnosis not present

## 2019-12-26 DIAGNOSIS — R2243 Localized swelling, mass and lump, lower limb, bilateral: Secondary | ICD-10-CM | POA: Insufficient documentation

## 2019-12-26 DIAGNOSIS — R202 Paresthesia of skin: Secondary | ICD-10-CM | POA: Diagnosis not present

## 2019-12-26 LAB — CBC
HCT: 42.4 % (ref 36.0–46.0)
Hemoglobin: 14.2 g/dL (ref 12.0–15.0)
MCH: 32.3 pg (ref 26.0–34.0)
MCHC: 33.5 g/dL (ref 30.0–36.0)
MCV: 96.6 fL (ref 80.0–100.0)
Platelets: 327 10*3/uL (ref 150–400)
RBC: 4.39 MIL/uL (ref 3.87–5.11)
RDW: 14 % (ref 11.5–15.5)
WBC: 10.1 10*3/uL (ref 4.0–10.5)
nRBC: 0 % (ref 0.0–0.2)

## 2019-12-26 LAB — BASIC METABOLIC PANEL
Anion gap: 8 (ref 5–15)
BUN: 10 mg/dL (ref 6–20)
CO2: 26 mmol/L (ref 22–32)
Calcium: 9.1 mg/dL (ref 8.9–10.3)
Chloride: 105 mmol/L (ref 98–111)
Creatinine, Ser: 0.79 mg/dL (ref 0.44–1.00)
GFR calc Af Amer: >60 mL/min (ref >60)
GFR calc non Af Amer: >60 mL/min (ref >60)
Glucose, Bld: 113 mg/dL — ABNORMAL HIGH (ref 70–99)
Potassium: 3.3 mmol/L — ABNORMAL LOW (ref 3.5–5.1)
Sodium: 139 mmol/L (ref 135–145)

## 2019-12-26 LAB — TROPONIN I (HIGH SENSITIVITY): Troponin I (High Sensitivity): 2 ng/L (ref <18)

## 2019-12-26 NOTE — ED Triage Notes (Signed)
Pt c/o left/central chest pain, headache, bilateral hand tingling, and bilateral lower extremity swelling that started at 1200 today.

## 2020-01-31 ENCOUNTER — Other Ambulatory Visit: Payer: Self-pay | Admitting: Sports Medicine

## 2020-01-31 DIAGNOSIS — G8929 Other chronic pain: Secondary | ICD-10-CM

## 2020-02-02 ENCOUNTER — Other Ambulatory Visit: Payer: Self-pay

## 2020-02-02 ENCOUNTER — Ambulatory Visit
Admission: RE | Admit: 2020-02-02 | Discharge: 2020-02-02 | Disposition: A | Discharge status: Home / Self Care | Payer: 59 | Source: Ambulatory Visit | Attending: Sports Medicine | Admitting: Sports Medicine

## 2020-02-02 ENCOUNTER — Other Ambulatory Visit: Payer: Self-pay | Admitting: Sports Medicine

## 2020-02-02 DIAGNOSIS — G8929 Other chronic pain: Secondary | ICD-10-CM

## 2020-02-02 DIAGNOSIS — M545 Low back pain, unspecified: Secondary | ICD-10-CM

## 2020-02-02 MED ORDER — METHYLPREDNISOLONE ACETATE 40 MG/ML INJ SUSP (RADIOLOG
120.0000 mg | Freq: Once | INTRAMUSCULAR | Status: AC
  Administered 2020-02-02: 120 mg via EPIDURAL

## 2020-02-02 MED ORDER — IOPAMIDOL (ISOVUE-M 200) INJECTION 41%
1.0000 mL | Freq: Once | INTRAMUSCULAR | Status: AC
  Administered 2020-02-02: 1 mL via EPIDURAL

## 2020-02-02 NOTE — Discharge Instructions (Signed)

## 2020-03-20 ENCOUNTER — Other Ambulatory Visit: Payer: Self-pay | Admitting: Sports Medicine

## 2020-03-20 DIAGNOSIS — G8929 Other chronic pain: Secondary | ICD-10-CM

## 2020-04-11 ENCOUNTER — Other Ambulatory Visit: Payer: 59

## 2020-04-11 ENCOUNTER — Ambulatory Visit
Admission: RE | Admit: 2020-04-11 | Discharge: 2020-04-11 | Disposition: A | Discharge status: Home / Self Care | Payer: 59 | Source: Ambulatory Visit | Attending: Sports Medicine | Admitting: Sports Medicine

## 2020-04-11 ENCOUNTER — Other Ambulatory Visit: Payer: Self-pay

## 2020-04-11 ENCOUNTER — Other Ambulatory Visit: Payer: Self-pay | Admitting: Sports Medicine

## 2020-04-11 DIAGNOSIS — G8929 Other chronic pain: Secondary | ICD-10-CM

## 2020-04-11 DIAGNOSIS — M545 Low back pain, unspecified: Secondary | ICD-10-CM

## 2020-04-11 MED ORDER — IOPAMIDOL (ISOVUE-M 200) INJECTION 41%
1.0000 mL | Freq: Once | INTRAMUSCULAR | Status: AC
  Administered 2020-04-11: 1 mL via EPIDURAL

## 2020-04-11 MED ORDER — METHYLPREDNISOLONE ACETATE 40 MG/ML INJ SUSP (RADIOLOG
120.0000 mg | Freq: Once | INTRAMUSCULAR | Status: AC
  Administered 2020-04-11: 120 mg via EPIDURAL

## 2020-04-11 NOTE — Discharge Instructions (Signed)

## 2020-04-12 ENCOUNTER — Other Ambulatory Visit: Payer: Self-pay | Admitting: Internal Medicine

## 2020-04-12 ENCOUNTER — Inpatient Hospital Stay: Admission: RE | Admit: 2020-04-12 | Payer: 59 | Source: Ambulatory Visit

## 2020-09-26 ENCOUNTER — Other Ambulatory Visit: Payer: Self-pay

## 2020-09-26 ENCOUNTER — Other Ambulatory Visit: Payer: Self-pay | Admitting: Sports Medicine

## 2020-09-26 ENCOUNTER — Ambulatory Visit
Admission: RE | Admit: 2020-09-26 | Discharge: 2020-09-26 | Disposition: A | Discharge status: Home / Self Care | Payer: 59 | Source: Ambulatory Visit | Attending: Sports Medicine | Admitting: Sports Medicine

## 2020-09-26 DIAGNOSIS — M5126 Other intervertebral disc displacement, lumbar region: Secondary | ICD-10-CM

## 2020-09-26 MED ORDER — IOPAMIDOL (ISOVUE-M 200) INJECTION 41%
1.0000 mL | Freq: Once | INTRAMUSCULAR | Status: AC
  Administered 2020-09-26: 1 mL via EPIDURAL

## 2020-09-26 MED ORDER — METHYLPREDNISOLONE ACETATE 40 MG/ML INJ SUSP (RADIOLOG
80.0000 mg | Freq: Once | INTRAMUSCULAR | Status: AC
  Administered 2020-09-26: 80 mg via EPIDURAL

## 2020-09-26 NOTE — Discharge Instructions (Signed)

## 2021-06-19 DIAGNOSIS — J4520 Mild intermittent asthma, uncomplicated: Secondary | ICD-10-CM | POA: Diagnosis not present

## 2021-06-19 DIAGNOSIS — Z5181 Encounter for therapeutic drug level monitoring: Secondary | ICD-10-CM | POA: Diagnosis not present

## 2021-06-19 DIAGNOSIS — I10 Essential (primary) hypertension: Secondary | ICD-10-CM | POA: Diagnosis not present

## 2021-06-19 DIAGNOSIS — M5000 Cervical disc disorder with myelopathy, unspecified cervical region: Secondary | ICD-10-CM | POA: Diagnosis not present

## 2021-06-19 DIAGNOSIS — Z7189 Other specified counseling: Secondary | ICD-10-CM | POA: Diagnosis not present

## 2021-06-19 DIAGNOSIS — R69 Illness, unspecified: Secondary | ICD-10-CM | POA: Diagnosis not present

## 2021-07-29 DIAGNOSIS — M5416 Radiculopathy, lumbar region: Secondary | ICD-10-CM | POA: Diagnosis not present

## 2021-07-29 DIAGNOSIS — Z716 Tobacco abuse counseling: Secondary | ICD-10-CM | POA: Diagnosis not present

## 2021-07-29 DIAGNOSIS — F17210 Nicotine dependence, cigarettes, uncomplicated: Secondary | ICD-10-CM | POA: Diagnosis not present

## 2021-07-29 DIAGNOSIS — R69 Illness, unspecified: Secondary | ICD-10-CM | POA: Diagnosis not present

## 2021-08-14 DIAGNOSIS — M5416 Radiculopathy, lumbar region: Secondary | ICD-10-CM | POA: Diagnosis not present

## 2021-08-21 DIAGNOSIS — E782 Mixed hyperlipidemia: Secondary | ICD-10-CM | POA: Diagnosis not present

## 2021-08-21 DIAGNOSIS — R7303 Prediabetes: Secondary | ICD-10-CM | POA: Diagnosis not present

## 2021-09-09 ENCOUNTER — Encounter: Payer: Self-pay | Admitting: *Deleted

## 2021-10-16 DIAGNOSIS — M5416 Radiculopathy, lumbar region: Secondary | ICD-10-CM | POA: Diagnosis not present

## 2021-10-21 DIAGNOSIS — M25561 Pain in right knee: Secondary | ICD-10-CM | POA: Diagnosis not present

## 2021-10-21 DIAGNOSIS — M461 Sacroiliitis, not elsewhere classified: Secondary | ICD-10-CM | POA: Diagnosis not present

## 2021-10-21 DIAGNOSIS — M5416 Radiculopathy, lumbar region: Secondary | ICD-10-CM | POA: Diagnosis not present

## 2021-10-22 DIAGNOSIS — M5416 Radiculopathy, lumbar region: Secondary | ICD-10-CM | POA: Diagnosis not present

## 2021-10-28 DIAGNOSIS — M5416 Radiculopathy, lumbar region: Secondary | ICD-10-CM | POA: Diagnosis not present

## 2021-11-06 ENCOUNTER — Ambulatory Visit: Payer: 59

## 2021-12-25 DIAGNOSIS — J069 Acute upper respiratory infection, unspecified: Secondary | ICD-10-CM | POA: Diagnosis not present

## 2021-12-25 DIAGNOSIS — R070 Pain in throat: Secondary | ICD-10-CM | POA: Diagnosis not present

## 2022-01-07 DIAGNOSIS — E782 Mixed hyperlipidemia: Secondary | ICD-10-CM | POA: Diagnosis not present

## 2022-01-07 DIAGNOSIS — R7303 Prediabetes: Secondary | ICD-10-CM | POA: Diagnosis not present

## 2022-01-11 DIAGNOSIS — R69 Illness, unspecified: Secondary | ICD-10-CM | POA: Diagnosis not present

## 2022-01-11 DIAGNOSIS — J449 Chronic obstructive pulmonary disease, unspecified: Secondary | ICD-10-CM | POA: Diagnosis not present

## 2022-01-11 DIAGNOSIS — R7303 Prediabetes: Secondary | ICD-10-CM | POA: Diagnosis not present

## 2022-01-11 DIAGNOSIS — E782 Mixed hyperlipidemia: Secondary | ICD-10-CM | POA: Diagnosis not present

## 2022-01-11 DIAGNOSIS — K219 Gastro-esophageal reflux disease without esophagitis: Secondary | ICD-10-CM | POA: Diagnosis not present

## 2022-01-11 DIAGNOSIS — Z716 Tobacco abuse counseling: Secondary | ICD-10-CM | POA: Diagnosis not present

## 2022-01-11 DIAGNOSIS — I10 Essential (primary) hypertension: Secondary | ICD-10-CM | POA: Diagnosis not present

## 2022-01-11 DIAGNOSIS — Z1211 Encounter for screening for malignant neoplasm of colon: Secondary | ICD-10-CM | POA: Diagnosis not present

## 2022-01-11 DIAGNOSIS — M545 Low back pain, unspecified: Secondary | ICD-10-CM | POA: Diagnosis not present

## 2022-01-11 DIAGNOSIS — Z6828 Body mass index (BMI) 28.0-28.9, adult: Secondary | ICD-10-CM | POA: Diagnosis not present

## 2022-01-11 DIAGNOSIS — E663 Overweight: Secondary | ICD-10-CM | POA: Diagnosis not present

## 2022-01-11 DIAGNOSIS — F411 Generalized anxiety disorder: Secondary | ICD-10-CM | POA: Diagnosis not present

## 2022-01-20 DIAGNOSIS — M5416 Radiculopathy, lumbar region: Secondary | ICD-10-CM | POA: Diagnosis not present

## 2022-01-20 DIAGNOSIS — M25561 Pain in right knee: Secondary | ICD-10-CM | POA: Diagnosis not present

## 2022-01-20 DIAGNOSIS — M461 Sacroiliitis, not elsewhere classified: Secondary | ICD-10-CM | POA: Diagnosis not present

## 2022-01-30 ENCOUNTER — Encounter: Payer: Self-pay | Admitting: *Deleted

## 2022-01-31 ENCOUNTER — Other Ambulatory Visit (HOSPITAL_COMMUNITY): Payer: Self-pay | Admitting: Internal Medicine

## 2022-01-31 DIAGNOSIS — Z1231 Encounter for screening mammogram for malignant neoplasm of breast: Secondary | ICD-10-CM

## 2022-02-13 ENCOUNTER — Ambulatory Visit (HOSPITAL_COMMUNITY): Payer: 59

## 2022-02-13 ENCOUNTER — Encounter: Payer: Self-pay | Admitting: *Deleted

## 2022-02-13 NOTE — Patient Instructions (Signed)
Procedure: Colonoscopy  Estimated body mass index is 29.12 kg/m as calculated from the following:   Height as of this encounter: 5\' 5"  (1.651 m).   Weight as of this encounter: 175 lb (79.4 kg).   Have you had a colonoscopy before?  no  Do you have family history of colon cancer  no  Do you have a family history of polyps? yes  Do you have a history colorectal cancer?   no  Are you diabetic?  no  Do you have a prosthetic or mechanical heart valve? no  Do you have a pacemaker/defibrillator?   no  Have you had endocarditis/atrial fibrillation?  no  Do you use supplemental oxygen/CPAP?  no  Have you had joint replacement within the last 12 months?  no  Do you tend to be constipated or have to use laxatives?  no   Do you have history of alcohol use? If yes, how much and how often.  no  Do you have history or are you using drugs? If yes, what do are you  using?  no  Have you ever had a stroke/heart attack?  no  Have you ever had a heart or other vascular stent placed,?no  Do you take weight loss medication? no  female patients,: have you had a hysterectomy? no                              are you post menopausal?  no                              do you still have your menstrual cycle? no    Date of last menstrual period.   Do you take any blood-thinning medications such as: (Plavix, aspirin, Coumadin, Aggrenox, Brilinta, Xarelto, Eliquis, Pradaxa, Savaysa or Effient) no  If yes we need the name, milligram, dosage and who is prescribing doctor:               Current Outpatient Medications  Medication Sig Dispense Refill   albuterol (PROVENTIL) (2.5 MG/3ML) 0.083% nebulizer solution Take 2.5 mg by nebulization every 6 (six) hours as needed for wheezing or shortness of breath.     albuterol (VENTOLIN HFA) 108 (90 Base) MCG/ACT inhaler Inhale 1-2 puffs into the lungs every 6 (six) hours as needed for wheezing or shortness of breath.     ALPRAZolam (XANAX) 1 MG tablet  Take 0.5-1 mg by mouth every 8 (eight) hours as needed for anxiety.     buPROPion (WELLBUTRIN XL) 150 MG 24 hr tablet Take 150 mg by mouth daily.     diclofenac (VOLTAREN) 75 MG EC tablet Take 75 mg by mouth 2 (two) times daily.     Diclofenac Sodium 1 % CREA Apply topically.     esomeprazole (NEXIUM) 40 MG capsule Take 40 mg by mouth daily.     hydrochlorothiazide (HYDRODIURIL) 25 MG tablet TK 1 T PO  QAM     levocetirizine (XYZAL) 5 MG tablet Take 5 mg by mouth daily.     pregabalin (LYRICA) 50 MG capsule Take 50 mg by mouth 3 (three) times daily.     promethazine (PHENERGAN) 6.25 MG/5ML syrup Take by mouth every 6 (six) hours as needed for nausea or vomiting.     traMADol (ULTRAM) 50 MG tablet Take 50 mg by mouth every 6 (six) hours as needed.     Current  Facility-Administered Medications  Medication Dose Route Frequency Provider Last Rate Last Admin   betamethasone acetate-betamethasone sodium phosphate (CELESTONE) injection 12 mg  12 mg Intramuscular Once Dohmeier, Porfirio Mylar, MD        Allergies  Allergen Reactions   Prednisone Other (See Comments)    Migraines

## 2022-02-21 NOTE — Progress Notes (Signed)
Ok to schedule. ASA 2. Needs bmet.

## 2022-02-24 ENCOUNTER — Ambulatory Visit (HOSPITAL_COMMUNITY): Payer: 59

## 2022-02-25 ENCOUNTER — Encounter: Payer: Self-pay | Admitting: *Deleted

## 2022-02-25 ENCOUNTER — Telehealth: Payer: Self-pay | Admitting: *Deleted

## 2022-02-25 DIAGNOSIS — Z1211 Encounter for screening for malignant neoplasm of colon: Secondary | ICD-10-CM

## 2022-02-25 MED ORDER — PEG 3350-KCL-NA BICARB-NACL 420 G PO SOLR: 4000.0000 mL | Freq: Once | ORAL | 0 refills | Status: AC

## 2022-02-25 NOTE — Telephone Encounter (Signed)
Pt left voice mail wanting to know about an appointment.    LMTRC

## 2022-02-25 NOTE — Progress Notes (Signed)
Called pt. Scheduled for 10/2 at 9:30am. Aware will mail instructions. Rx for prep sent to pharmacy. Aware needs BMET prior

## 2022-02-25 NOTE — Telephone Encounter (Signed)
Pt informed that she will receive a telephone call on 03/13/22 from the pre-op nurse from [facility]

## 2022-02-26 ENCOUNTER — Encounter: Payer: Self-pay | Admitting: *Deleted

## 2022-03-03 ENCOUNTER — Encounter (HOSPITAL_COMMUNITY): Payer: Self-pay

## 2022-03-03 ENCOUNTER — Ambulatory Visit (HOSPITAL_COMMUNITY)
Admission: RE | Admit: 2022-03-03 | Discharge: 2022-03-03 | Disposition: A | Discharge status: Home / Self Care | Payer: 59 | Source: Ambulatory Visit | Attending: Internal Medicine | Admitting: Internal Medicine

## 2022-03-03 ENCOUNTER — Other Ambulatory Visit (HOSPITAL_COMMUNITY)
Admission: RE | Admit: 2022-03-03 | Discharge: 2022-03-03 | Disposition: A | Discharge status: Home / Self Care | Payer: 59 | Source: Ambulatory Visit

## 2022-03-03 DIAGNOSIS — Z1211 Encounter for screening for malignant neoplasm of colon: Secondary | ICD-10-CM | POA: Insufficient documentation

## 2022-03-03 DIAGNOSIS — Z1231 Encounter for screening mammogram for malignant neoplasm of breast: Secondary | ICD-10-CM | POA: Diagnosis not present

## 2022-03-03 LAB — BASIC METABOLIC PANEL
Anion gap: 5 (ref 5–15)
BUN: 10 mg/dL (ref 6–20)
CO2: 28 mmol/L (ref 22–32)
Calcium: 8.8 mg/dL — ABNORMAL LOW (ref 8.9–10.3)
Chloride: 104 mmol/L (ref 98–111)
Creatinine, Ser: 0.83 mg/dL (ref 0.44–1.00)
GFR, Estimated: >60 mL/min (ref >60)
Glucose, Bld: 84 mg/dL (ref 70–99)
Potassium: 4.1 mmol/L (ref 3.5–5.1)
Sodium: 137 mmol/L (ref 135–145)

## 2022-03-04 ENCOUNTER — Inpatient Hospital Stay
Admission: RE | Admit: 2022-03-04 | Discharge: 2022-03-04 | Disposition: A | Discharge status: Home / Self Care | Payer: Self-pay | Source: Ambulatory Visit | Attending: Internal Medicine | Admitting: Internal Medicine

## 2022-03-04 ENCOUNTER — Other Ambulatory Visit (HOSPITAL_COMMUNITY): Payer: Self-pay | Admitting: Internal Medicine

## 2022-03-04 DIAGNOSIS — Z1231 Encounter for screening mammogram for malignant neoplasm of breast: Secondary | ICD-10-CM

## 2022-03-13 ENCOUNTER — Encounter (HOSPITAL_COMMUNITY)
Admission: RE | Admit: 2022-03-13 | Discharge: 2022-03-13 | Disposition: A | Discharge status: Home / Self Care | Payer: 59 | Source: Ambulatory Visit | Attending: Internal Medicine | Admitting: Internal Medicine

## 2022-03-13 ENCOUNTER — Encounter (HOSPITAL_COMMUNITY): Payer: Self-pay

## 2022-03-13 HISTORY — DX: Nausea with vomiting, unspecified: R11.2

## 2022-03-13 HISTORY — DX: Nausea with vomiting, unspecified: Z98.890

## 2022-03-17 ENCOUNTER — Encounter (HOSPITAL_COMMUNITY)
Admission: RE | Disposition: A | Discharge status: Home / Self Care | Payer: Self-pay | Source: Home / Self Care | Attending: Internal Medicine

## 2022-03-17 ENCOUNTER — Ambulatory Visit (HOSPITAL_COMMUNITY): Payer: 59 | Admitting: Anesthesiology

## 2022-03-17 ENCOUNTER — Ambulatory Visit (HOSPITAL_BASED_OUTPATIENT_CLINIC_OR_DEPARTMENT_OTHER): Payer: 59 | Admitting: Anesthesiology

## 2022-03-17 ENCOUNTER — Ambulatory Visit (HOSPITAL_COMMUNITY)
Admission: RE | Admit: 2022-03-17 | Discharge: 2022-03-17 | Disposition: A | Discharge status: Home / Self Care | Payer: 59 | Attending: Internal Medicine | Admitting: Internal Medicine

## 2022-03-17 ENCOUNTER — Encounter (HOSPITAL_COMMUNITY): Payer: Self-pay

## 2022-03-17 DIAGNOSIS — R69 Illness, unspecified: Secondary | ICD-10-CM | POA: Diagnosis not present

## 2022-03-17 DIAGNOSIS — F17210 Nicotine dependence, cigarettes, uncomplicated: Secondary | ICD-10-CM | POA: Diagnosis not present

## 2022-03-17 DIAGNOSIS — Z1211 Encounter for screening for malignant neoplasm of colon: Secondary | ICD-10-CM | POA: Insufficient documentation

## 2022-03-17 DIAGNOSIS — Z1212 Encounter for screening for malignant neoplasm of rectum: Secondary | ICD-10-CM

## 2022-03-17 DIAGNOSIS — M5481 Occipital neuralgia: Secondary | ICD-10-CM | POA: Insufficient documentation

## 2022-03-17 DIAGNOSIS — K5730 Diverticulosis of large intestine without perforation or abscess without bleeding: Secondary | ICD-10-CM

## 2022-03-17 HISTORY — PX: COLONOSCOPY WITH PROPOFOL: SHX5780

## 2022-03-17 SURGERY — COLONOSCOPY WITH PROPOFOL
Anesthesia: General

## 2022-03-17 MED ORDER — LIDOCAINE 2% (20 MG/ML) 5 ML SYRINGE
INTRAMUSCULAR | Status: DC | PRN
  Administered 2022-03-17: 80 mg via INTRAVENOUS

## 2022-03-17 MED ORDER — LACTATED RINGERS IV SOLN: INTRAVENOUS | Status: DC | PRN

## 2022-03-17 MED ORDER — PROPOFOL 10 MG/ML IV BOLUS
INTRAVENOUS | Status: DC | PRN
  Administered 2022-03-17: 150 ug/kg/min via INTRAVENOUS

## 2022-03-17 MED ORDER — LACTATED RINGERS IV SOLN: INTRAVENOUS | Status: DC

## 2022-03-17 NOTE — Discharge Instructions (Addendum)
  Colonoscopy Discharge Instructions  Read the instructions outlined below and refer to this sheet in the next few weeks. These discharge instructions provide you with general information on caring for yourself after you leave the hospital. Your doctor may also give you specific instructions. While your treatment has been planned according to the most current medical practices available, unavoidable complications occasionally occur.   ACTIVITY You may resume your regular activity, but move at a slower pace for the next 24 hours.  Take frequent rest periods for the next 24 hours.  Walking will help get rid of the air and reduce the bloated feeling in your belly (abdomen).  No driving for 24 hours (because of the medicine (anesthesia) used during the test).   Do not sign any important legal documents or operate any machinery for 24 hours (because of the anesthesia used during the test).  NUTRITION Drink plenty of fluids.  You may resume your normal diet as instructed by your doctor.  Begin with a light meal and progress to your normal diet. Heavy or fried foods are harder to digest and may make you feel sick to your stomach (nauseated).  Avoid alcoholic beverages for 24 hours or as instructed.  MEDICATIONS You may resume your normal medications unless your doctor tells you otherwise.  WHAT YOU CAN EXPECT TODAY Some feelings of bloating in the abdomen.  Passage of more gas than usual.  Spotting of blood in your stool or on the toilet paper.  IF YOU HAD POLYPS REMOVED DURING THE COLONOSCOPY: No aspirin products for 7 days or as instructed.  No alcohol for 7 days or as instructed.  Eat a soft diet for the next 24 hours.  FINDING OUT THE RESULTS OF YOUR TEST Not all test results are available during your visit. If your test results are not back during the visit, make an appointment with your caregiver to find out the results. Do not assume everything is normal if you have not heard from your  caregiver or the medical facility. It is important for you to follow up on all of your test results.  SEEK IMMEDIATE MEDICAL ATTENTION IF: You have more than a spotting of blood in your stool.  Your belly is swollen (abdominal distention).  You are nauseated or vomiting.  You have a temperature over 101.  You have abdominal pain or discomfort that is severe or gets worse throughout the day.   Your colonoscopy was relatively unremarkable.  I did not find any polyps or evidence of colon cancer.  I recommend repeating colonoscopy in 10 years for colon cancer screening purposes.  You do have mild diverticulosis. I would recommend increasing fiber in your diet or adding OTC Benefiber/Metamucil. Be sure to drink at least 4 to 6 glasses of water daily. Follow-up with GI as needed.   I hope you have a great rest of your week!  [name]. [name], D.O. Gastroenterology and Hepatology [facility]

## 2022-03-17 NOTE — Transfer of Care (Signed)
Immediate Anesthesia Transfer of Care Note  Patient: [name]  Procedure(s) Performed: COLONOSCOPY WITH PROPOFOL  Patient Location: PACU  Anesthesia Type:General  Level of Consciousness: awake  Airway & Oxygen Therapy: Patient Spontanous Breathing  Post-op Assessment: Report given to RN and Post -op Vital signs reviewed and stable  Post vital signs: Reviewed and stable  Last Vitals:  Vitals Value Taken Time  BP 93/54 03/17/22 0917  Temp 36.4 C 03/17/22 0917  Pulse 62 03/17/22 0917  Resp 16 03/17/22 0917  SpO2 100 % 03/17/22 0917    Last Pain:  Vitals:   03/17/22 0917  TempSrc: Oral  PainSc: 0-No pain         Complications: No notable events documented.

## 2022-03-17 NOTE — H&P (Signed)
Primary Care Physician:  Benita Stabile, MD Primary Gastroenterologist:  Dr. Marletta Lor  Pre-Procedure History & Physical: HPI:  [name] is a 56 y.o. female is here for first ever colonoscopy for colon cancer screening purposes.  Patient denies any family history of colorectal cancer.  No melena or hematochezia.  No abdominal pain or unintentional weight loss.  No change in bowel habits.  Overall feels well from a GI standpoint.  Past Medical History:  Diagnosis Date   Adjustment disorder with mixed anxiety and depressed mood    Bronchitis due to tobacco use    Cervicalgia    Cervico-occipital neuralgia    Headache(784.0)    Pleuritis    Pneumonia due to adenovirus    PONV (postoperative nausea and vomiting)     Past Surgical History:  Procedure Laterality Date   FRACTURE SURGERY     GANGLION CYST EXCISION     left hand wrist   HEMORRHOID SURGERY      Prior to Admission medications   Medication Sig Start Date End Date Taking? Authorizing Provider  albuterol (VENTOLIN HFA) 108 (90 Base) MCG/ACT inhaler Inhale 1-2 puffs into the lungs every 6 (six) hours as needed for wheezing or shortness of breath.   Yes [provider]  ALPRAZolam Prudy Feeler) 1 MG tablet Take 0.5-1 mg by mouth at bedtime as needed for anxiety.   Yes [provider]  buPROPion (WELLBUTRIN XL) 150 MG 24 hr tablet Take 150 mg by mouth daily.   Yes [provider]  diclofenac (VOLTAREN) 75 MG EC tablet Take 75 mg by mouth 2 (two) times daily.   Yes [provider]  diclofenac Sodium (VOLTAREN) 1 % GEL Apply 1 Application topically 4 (four) times daily as needed (pain).   Yes [provider]  esomeprazole (NEXIUM) 40 MG capsule Take 40 mg by mouth daily. 07/11/19  Yes [provider]  hydrochlorothiazide (HYDRODIURIL) 25 MG tablet Take 25 mg by mouth daily. 01/01/19  Yes [provider]  levocetirizine (XYZAL) 5 MG tablet Take 5 mg by mouth at bedtime. 07/23/19   Yes [provider]  pregabalin (LYRICA) 50 MG capsule Take 150 mg by mouth 2 (two) times daily.   Yes [provider]  simvastatin (ZOCOR) 20 MG tablet Take 20 mg by mouth daily. 02/07/22  Yes [provider]  traMADol (ULTRAM) 50 MG tablet Take 50 mg by mouth 2 (two) times daily as needed for moderate pain.   Yes [provider]  albuterol (PROVENTIL) (2.5 MG/3ML) 0.083% nebulizer solution Take 2.5 mg by nebulization every 6 (six) hours as needed for wheezing or shortness of breath.    [provider]    Allergies as of 02/25/2022 - Review Complete 09/26/2020  Allergen Reaction Noted   Prednisone Other (See Comments) 06/06/2019    Family History  Problem Relation Age of Onset   Anxiety disorder Sister    Anxiety disorder Sister    Breast cancer Maternal Aunt    Hypertension Brother     Social History   Socioeconomic History   Marital status: Married    Spouse name: Not on file   Number of children: 0   Years of education: 12 th   Highest education level: Not on file  Occupational History    Comment: [employer]  Tobacco Use   Smoking status: Every Day    Packs/day: 0.50    Types: Cigarettes   Smokeless tobacco: Never   Tobacco comments:  Quit 2016, 03/2018 smoking  Vaping Use   Vaping Use: Never used  Substance and Sexual Activity   Alcohol use: Yes    Alcohol/week: 6.0 - 8.0 standard drinks of alcohol    Types: 6 - 8 Glasses of wine per week    Comment: 6-8 glasses weekly, 04/15/18 1 /month   Drug use: No   Sexual activity: Yes    Birth control/protection: Post-menopausal  Other Topics Concern   Not on file  Social History Narrative   Patient lives at home with her husband [name]). 04/15/18 separated      Patient is unemployed. Works part time at a Pharmacist, community.   Education 12 th    Right handed.   Caffeine two cups of coffee daily.   Social Determinants of Health   Financial Resource Strain: Low Risk   (09/29/2019)   Overall Financial Resource Strain (CARDIA)    Difficulty of Paying Living Expenses: Not very hard  Food Insecurity: No Food Insecurity (09/29/2019)   Hunger Vital Sign    Worried About Running Out of Food in the Last Year: Never true    Ran Out of Food in the Last Year: Never true  Transportation Needs: No Transportation Needs (09/29/2019)   PRAPARE - Hydrologist (Medical): No    Lack of Transportation (Non-Medical): No  Physical Activity: Insufficiently Active (09/29/2019)   Exercise Vital Sign    Days of Exercise per Week: 2 days    Minutes of Exercise per Session: 60 min  Stress: Stress Concern Present (09/29/2019)   [place]    Feeling of Stress : To some extent  Social Connections: Moderately Integrated (09/29/2019)   Social Connection and Isolation Panel [NHANES]    Frequency of Communication with Friends and Family: More than three times a week    Frequency of Social Gatherings with Friends and Family: More than three times a week    Attends Religious Services: More than 4 times per year    Active Member of Clubs or Organizations: No    Attends Archivist Meetings: Never    Marital Status: Married  Human resources officer Violence: Unknown (09/29/2019)   Humiliation, Afraid, Rape, and Kick questionnaire    Fear of Current or Ex-Partner: Patient refused    Emotionally Abused: Patient refused    Physically Abused: Patient refused    Sexually Abused: No    Review of Systems: See HPI, otherwise negative ROS  Physical Exam: Vital signs in last 24 hours: Temp:  [98.9 F (37.2 C)] 98.9 F (37.2 C) (10/02 0826) Pulse Rate:  [65] 65 (10/02 0826) Resp:  [14] 14 (10/02 0826) BP: (116)/(80) 116/80 (10/02 0826) SpO2:  [98 %] 98 % (10/02 0826) Weight:  [78.5 kg] 78.5 kg (10/02 0826)   General:   Alert,  Well-developed, well-nourished, pleasant and cooperative in  NAD Head:  Normocephalic and atraumatic. Eyes:  Sclera clear, no icterus.   Conjunctiva pink. Ears:  Normal auditory acuity. Nose:  No deformity, discharge,  or lesions. Mouth:  No deformity or lesions, dentition normal. Neck:  Supple; no masses or thyromegaly. Lungs:  Clear throughout to auscultation.   No wheezes, crackles, or rhonchi. No acute distress. Heart:  Regular rate and rhythm; no murmurs, clicks, rubs,  or gallops. Abdomen:  Soft, nontender and nondistended. No masses, hepatosplenomegaly or hernias noted. Normal bowel sounds, without guarding, and without rebound.   Msk:  Symmetrical without gross deformities. Normal  posture. Extremities:  Without clubbing or edema. Neurologic:  Alert and  oriented x4;  grossly normal neurologically. Skin:  Intact without significant lesions or rashes. Cervical Nodes:  No significant cervical adenopathy. Psych:  Alert and cooperative. Normal mood and affect.  Impression/Plan: [name] is here for a colonoscopy to be performed for colon cancer screening purposes.  The risks of the procedure including infection, bleed, or perforation as well as benefits, limitations, alternatives and imponderables have been reviewed with the patient. Questions have been answered. All parties agreeable.

## 2022-03-17 NOTE — Op Note (Signed)
[facility] Patient Name: [name] Procedure Date: 03/17/2022 8:52 AM MRN: [id number] Date of Birth: Feb 20, 1966 Attending MD: Elon Alas. Abbey Chatters DO CSN: [id number] Age: 56 Admit Type: Outpatient Procedure:                Colonoscopy Indications:              Screening for colorectal malignant neoplasm Providers:                Elon Alas. Abbey Chatters, DO, Caprice Kluver, Everardo Pacific Referring MD:              Medicines:                See the Anesthesia note for documentation of the                            administered medications Complications:            No immediate complications. Estimated Blood Loss:     Estimated blood loss: none. Procedure:                Pre-Anesthesia Assessment:                           - The anesthesia plan was to use monitored                            anesthesia care (MAC).                           After obtaining informed consent, the colonoscope                            was passed under direct vision. Throughout the                            procedure, the patient's blood pressure, pulse, and                            oxygen saturations were monitored continuously. The                            [id number]-HQ190L ([id number]) scope was introduced through                            the anus and advanced to the the cecum, identified                            by appendiceal orifice and ileocecal valve. The                            colonoscopy was performed without difficulty. The                            patient tolerated the procedure well. The quality                            of the  bowel preparation was evaluated using the                            BBPS [facility] Bowel Preparation Scale) with scores                            of: Right Colon = 3, Transverse Colon = 3 and Left                            Colon = 3 (entire mucosa seen well with no residual                            staining, small fragments of stool or opaque                             liquid). The total BBPS score equals 9. Scope In: 9:02:33 AM Scope Out: 9:12:25 AM Scope Withdrawal Time: 0 hours 7 minutes 59 seconds  Total Procedure Duration: 0 hours 9 minutes 52 seconds  Findings:      The perianal and digital rectal examinations were normal.      A few small-mouthed diverticula were found in the sigmoid colon.      The exam was otherwise without abnormality on direct and retroflexion       views. Impression:               - Diverticulosis in the sigmoid colon.                           - The examination was otherwise normal on direct                            and retroflexion views.                           - No specimens collected. Moderate Sedation:      Per Anesthesia Care Recommendation:           - Patient has a contact number available for                            emergencies. The signs and symptoms of potential                            delayed complications were discussed with the                            patient. Return to normal activities tomorrow.                            Written discharge instructions were provided to the                            patient.                           - Resume previous diet.                           -  Continue present medications.                           - Repeat colonoscopy in 10 years for screening                            purposes.                           - Return to GI clinic PRN. Procedure Code(s):        --- Professional ---                           J1216, Colorectal cancer screening; colonoscopy on                            individual not meeting criteria for high risk Diagnosis Code(s):        --- Professional ---                           Z12.11, Encounter for screening for malignant                            neoplasm of colon                           K57.30, Diverticulosis of large intestine without                            perforation or abscess without bleeding CPT  copyright 2019 American Medical Association. All rights reserved. The codes documented in this report are preliminary and upon coder review may  be revised to meet current compliance requirements. Elon Alas. Abbey Chatters, DO [place] Abbey Chatters, DO 03/17/2022 9:13:56 AM This report has been signed electronically. Number of Addenda: 0

## 2022-03-17 NOTE — Anesthesia Postprocedure Evaluation (Signed)
Anesthesia Post Note  Patient: [name]  Procedure(s) Performed: COLONOSCOPY WITH PROPOFOL  Patient location during evaluation: Phase II Anesthesia Type: General Level of consciousness: awake and alert and oriented Pain management: pain level controlled Vital Signs Assessment: post-procedure vital signs reviewed and stable Respiratory status: spontaneous breathing, nonlabored ventilation and respiratory function stable Cardiovascular status: blood pressure returned to baseline and stable Postop Assessment: no apparent nausea or vomiting Anesthetic complications: no   No notable events documented.   Last Vitals:  Vitals:   03/17/22 0826 03/17/22 0917  BP: 116/80 (!) 93/54  Pulse: 65 62  Resp: 14 16  Temp: 37.2 C 36.4 C  SpO2: 98% 100%    Last Pain:  Vitals:   03/17/22 0917  TempSrc: Oral  PainSc: 0-No pain                 Lynsay Fesperman C Lavaughn Haberle

## 2022-03-17 NOTE — Anesthesia Preprocedure Evaluation (Signed)
Anesthesia Evaluation  Patient identified by MRN, date of birth, ID band Patient awake    Reviewed: Allergy & Precautions, NPO status , Patient's Chart, lab work & pertinent test results  History of Anesthesia Complications (+) PONV and history of anesthetic complications  Airway Mallampati: II  TM Distance: >3 FB Neck ROM: Full    Dental  (+) Dental Advisory Given, Missing   Pulmonary pneumonia, resolved, Current Smoker and Patient abstained from smoking.,    Pulmonary exam normal breath sounds clear to auscultation       Cardiovascular negative cardio ROS Normal cardiovascular exam Rhythm:Regular Rate:Normal     Neuro/Psych  Headaches, PSYCHIATRIC DISORDERS  Neuromuscular disease (Cervico-occipital neuralgia)    GI/Hepatic negative GI ROS, (+)     substance abuse  alcohol use,   Endo/Other  negative endocrine ROS  Renal/GU negative Renal ROS  negative genitourinary   Musculoskeletal negative musculoskeletal ROS (+)   Abdominal   Peds negative pediatric ROS (+)  Hematology negative hematology ROS (+)   Anesthesia Other Findings   Reproductive/Obstetrics negative OB ROS                             Anesthesia Physical Anesthesia Plan  ASA: 2  Anesthesia Plan: General   Post-op Pain Management: Minimal or no pain anticipated   Induction: Intravenous  PONV Risk Score and Plan: Propofol infusion  Airway Management Planned: Nasal Cannula and Natural Airway  Additional Equipment:   Intra-op Plan:   Post-operative Plan:   Informed Consent: I have reviewed the patients History and Physical, chart, labs and discussed the procedure including the risks, benefits and alternatives for the proposed anesthesia with the patient or authorized representative who has indicated his/her understanding and acceptance.     Dental advisory given  Plan Discussed with: CRNA and  Surgeon  Anesthesia Plan Comments:         Anesthesia Quick Evaluation

## 2022-03-24 ENCOUNTER — Encounter (HOSPITAL_COMMUNITY): Payer: Self-pay | Admitting: Internal Medicine

## 2022-04-05 DIAGNOSIS — M545 Low back pain, unspecified: Secondary | ICD-10-CM | POA: Diagnosis not present

## 2022-04-05 DIAGNOSIS — R69 Illness, unspecified: Secondary | ICD-10-CM | POA: Diagnosis not present

## 2022-04-17 DIAGNOSIS — L720 Epidermal cyst: Secondary | ICD-10-CM | POA: Diagnosis not present

## 2022-04-17 DIAGNOSIS — L728 Other follicular cysts of the skin and subcutaneous tissue: Secondary | ICD-10-CM | POA: Diagnosis not present

## 2022-04-17 DIAGNOSIS — L821 Other seborrheic keratosis: Secondary | ICD-10-CM | POA: Diagnosis not present

## 2022-04-28 DIAGNOSIS — Z6827 Body mass index (BMI) 27.0-27.9, adult: Secondary | ICD-10-CM | POA: Diagnosis not present

## 2022-04-28 DIAGNOSIS — O8620 Urinary tract infection following delivery, unspecified: Secondary | ICD-10-CM | POA: Diagnosis not present

## 2022-04-28 DIAGNOSIS — R300 Dysuria: Secondary | ICD-10-CM | POA: Diagnosis not present

## 2022-04-28 DIAGNOSIS — N899 Noninflammatory disorder of vagina, unspecified: Secondary | ICD-10-CM | POA: Diagnosis not present

## 2022-05-19 DIAGNOSIS — M25561 Pain in right knee: Secondary | ICD-10-CM | POA: Diagnosis not present

## 2022-05-19 DIAGNOSIS — M461 Sacroiliitis, not elsewhere classified: Secondary | ICD-10-CM | POA: Diagnosis not present

## 2022-05-19 DIAGNOSIS — M5416 Radiculopathy, lumbar region: Secondary | ICD-10-CM | POA: Diagnosis not present

## 2022-06-17 DIAGNOSIS — E782 Mixed hyperlipidemia: Secondary | ICD-10-CM | POA: Diagnosis not present

## 2022-06-17 DIAGNOSIS — R7303 Prediabetes: Secondary | ICD-10-CM | POA: Diagnosis not present

## 2022-06-20 DIAGNOSIS — R69 Illness, unspecified: Secondary | ICD-10-CM | POA: Diagnosis not present

## 2022-06-20 DIAGNOSIS — R202 Paresthesia of skin: Secondary | ICD-10-CM | POA: Diagnosis not present

## 2022-06-20 DIAGNOSIS — R7303 Prediabetes: Secondary | ICD-10-CM | POA: Diagnosis not present

## 2022-06-20 DIAGNOSIS — E663 Overweight: Secondary | ICD-10-CM | POA: Diagnosis not present

## 2022-06-20 DIAGNOSIS — Z6828 Body mass index (BMI) 28.0-28.9, adult: Secondary | ICD-10-CM | POA: Diagnosis not present

## 2022-06-20 DIAGNOSIS — E782 Mixed hyperlipidemia: Secondary | ICD-10-CM | POA: Diagnosis not present

## 2022-06-20 DIAGNOSIS — J449 Chronic obstructive pulmonary disease, unspecified: Secondary | ICD-10-CM | POA: Diagnosis not present

## 2022-06-20 DIAGNOSIS — M545 Low back pain, unspecified: Secondary | ICD-10-CM | POA: Diagnosis not present

## 2022-06-20 DIAGNOSIS — I10 Essential (primary) hypertension: Secondary | ICD-10-CM | POA: Diagnosis not present

## 2022-06-20 DIAGNOSIS — K219 Gastro-esophageal reflux disease without esophagitis: Secondary | ICD-10-CM | POA: Diagnosis not present

## 2022-10-17 DIAGNOSIS — L720 Epidermal cyst: Secondary | ICD-10-CM | POA: Diagnosis not present

## 2022-10-17 DIAGNOSIS — L728 Other follicular cysts of the skin and subcutaneous tissue: Secondary | ICD-10-CM | POA: Diagnosis not present

## 2023-01-07 DIAGNOSIS — R7303 Prediabetes: Secondary | ICD-10-CM | POA: Diagnosis not present

## 2023-01-07 DIAGNOSIS — E782 Mixed hyperlipidemia: Secondary | ICD-10-CM | POA: Diagnosis not present

## 2023-01-19 DIAGNOSIS — E782 Mixed hyperlipidemia: Secondary | ICD-10-CM | POA: Diagnosis not present

## 2023-01-19 DIAGNOSIS — R202 Paresthesia of skin: Secondary | ICD-10-CM | POA: Diagnosis not present

## 2023-01-19 DIAGNOSIS — R7303 Prediabetes: Secondary | ICD-10-CM | POA: Diagnosis not present

## 2023-01-19 DIAGNOSIS — F17210 Nicotine dependence, cigarettes, uncomplicated: Secondary | ICD-10-CM | POA: Diagnosis not present

## 2023-01-19 DIAGNOSIS — I10 Essential (primary) hypertension: Secondary | ICD-10-CM | POA: Diagnosis not present

## 2023-01-19 DIAGNOSIS — K219 Gastro-esophageal reflux disease without esophagitis: Secondary | ICD-10-CM | POA: Diagnosis not present

## 2023-01-19 DIAGNOSIS — J449 Chronic obstructive pulmonary disease, unspecified: Secondary | ICD-10-CM | POA: Diagnosis not present

## 2023-01-19 DIAGNOSIS — M545 Low back pain, unspecified: Secondary | ICD-10-CM | POA: Diagnosis not present

## 2023-01-19 DIAGNOSIS — G8929 Other chronic pain: Secondary | ICD-10-CM | POA: Diagnosis not present

## 2023-01-19 DIAGNOSIS — F17200 Nicotine dependence, unspecified, uncomplicated: Secondary | ICD-10-CM | POA: Diagnosis not present

## 2023-01-19 DIAGNOSIS — Z0000 Encounter for general adult medical examination without abnormal findings: Secondary | ICD-10-CM | POA: Diagnosis not present

## 2023-01-19 DIAGNOSIS — F411 Generalized anxiety disorder: Secondary | ICD-10-CM | POA: Diagnosis not present

## 2023-04-26 ENCOUNTER — Emergency Department (HOSPITAL_COMMUNITY)
Admission: EM | Admit: 2023-04-26 | Discharge: 2023-04-26 | Disposition: A | Discharge status: Home / Self Care | Payer: 59 | Attending: Emergency Medicine | Admitting: Emergency Medicine

## 2023-04-26 ENCOUNTER — Other Ambulatory Visit: Payer: Self-pay

## 2023-04-26 ENCOUNTER — Emergency Department (HOSPITAL_COMMUNITY): Payer: 59

## 2023-04-26 ENCOUNTER — Encounter (HOSPITAL_COMMUNITY): Payer: Self-pay | Admitting: Emergency Medicine

## 2023-04-26 DIAGNOSIS — S3993XA Unspecified injury of pelvis, initial encounter: Secondary | ICD-10-CM | POA: Diagnosis not present

## 2023-04-26 DIAGNOSIS — W08XXXA Fall from other furniture, initial encounter: Secondary | ICD-10-CM | POA: Insufficient documentation

## 2023-04-26 DIAGNOSIS — Z79899 Other long term (current) drug therapy: Secondary | ICD-10-CM | POA: Diagnosis not present

## 2023-04-26 DIAGNOSIS — S299XXA Unspecified injury of thorax, initial encounter: Secondary | ICD-10-CM | POA: Diagnosis not present

## 2023-04-26 DIAGNOSIS — E871 Hypo-osmolality and hyponatremia: Secondary | ICD-10-CM | POA: Insufficient documentation

## 2023-04-26 DIAGNOSIS — R918 Other nonspecific abnormal finding of lung field: Secondary | ICD-10-CM | POA: Diagnosis not present

## 2023-04-26 DIAGNOSIS — S2242XA Multiple fractures of ribs, left side, initial encounter for closed fracture: Secondary | ICD-10-CM | POA: Insufficient documentation

## 2023-04-26 DIAGNOSIS — J90 Pleural effusion, not elsewhere classified: Secondary | ICD-10-CM | POA: Diagnosis not present

## 2023-04-26 DIAGNOSIS — S3991XA Unspecified injury of abdomen, initial encounter: Secondary | ICD-10-CM | POA: Diagnosis not present

## 2023-04-26 DIAGNOSIS — D72829 Elevated white blood cell count, unspecified: Secondary | ICD-10-CM | POA: Diagnosis not present

## 2023-04-26 DIAGNOSIS — W19XXXA Unspecified fall, initial encounter: Secondary | ICD-10-CM

## 2023-04-26 DIAGNOSIS — J9811 Atelectasis: Secondary | ICD-10-CM | POA: Diagnosis not present

## 2023-04-26 LAB — COMPREHENSIVE METABOLIC PANEL
ALT: 20 U/L (ref 0–44)
AST: 18 U/L (ref 15–41)
Albumin: 4.6 g/dL (ref 3.5–5.0)
Alkaline Phosphatase: 79 U/L (ref 38–126)
Anion gap: 11 (ref 5–15)
BUN: 13 mg/dL (ref 6–20)
CO2: 26 mmol/L (ref 22–32)
Calcium: 9.4 mg/dL (ref 8.9–10.3)
Chloride: 91 mmol/L — ABNORMAL LOW (ref 98–111)
Creatinine, Ser: 0.77 mg/dL (ref 0.44–1.00)
GFR, Estimated: >60 mL/min (ref >60)
Glucose, Bld: 100 mg/dL — ABNORMAL HIGH (ref 70–99)
Potassium: 3.7 mmol/L (ref 3.5–5.1)
Sodium: 128 mmol/L — ABNORMAL LOW (ref 135–145)
Total Bilirubin: 0.6 mg/dL (ref <1.2)
Total Protein: 7.7 g/dL (ref 6.5–8.1)

## 2023-04-26 LAB — CBC WITH DIFFERENTIAL/PLATELET
Abs Immature Granulocytes: 0.05 10*3/uL (ref 0.00–0.07)
Basophils Absolute: 0 10*3/uL (ref 0.0–0.1)
Basophils Relative: 0 %
Eosinophils Absolute: 0.1 10*3/uL (ref 0.0–0.5)
Eosinophils Relative: 1 %
HCT: 40.5 % (ref 36.0–46.0)
Hemoglobin: 14 g/dL (ref 12.0–15.0)
Immature Granulocytes: 0 %
Lymphocytes Relative: 14 %
Lymphs Abs: 1.7 10*3/uL (ref 0.7–4.0)
MCH: 32.1 pg (ref 26.0–34.0)
MCHC: 34.6 g/dL (ref 30.0–36.0)
MCV: 92.9 fL (ref 80.0–100.0)
Monocytes Absolute: 0.8 10*3/uL (ref 0.1–1.0)
Monocytes Relative: 7 %
Neutro Abs: 9.7 10*3/uL — ABNORMAL HIGH (ref 1.7–7.7)
Neutrophils Relative %: 78 %
Platelets: 303 10*3/uL (ref 150–400)
RBC: 4.36 MIL/uL (ref 3.87–5.11)
RDW: 13.4 % (ref 11.5–15.5)
WBC: 12.5 10*3/uL — ABNORMAL HIGH (ref 4.0–10.5)
nRBC: 0 % (ref 0.0–0.2)

## 2023-04-26 MED ORDER — HYDROMORPHONE HCL 2 MG PO TABS
4.0000 mg | ORAL_TABLET | Freq: Once | ORAL | Status: AC
  Administered 2023-04-26: 4 mg via ORAL
  Filled 2023-04-26: qty 2

## 2023-04-26 MED ORDER — MORPHINE SULFATE 15 MG PO TABS: 15.0000 mg | ORAL_TABLET | ORAL | 0 refills | Status: DC | PRN

## 2023-04-26 MED ORDER — HYDROMORPHONE HCL 1 MG/ML IJ SOLN
1.0000 mg | Freq: Once | INTRAMUSCULAR | Status: AC
  Administered 2023-04-26: 1 mg via INTRAVENOUS
  Filled 2023-04-26: qty 1

## 2023-04-26 MED ORDER — IOHEXOL 300 MG/ML  SOLN
100.0000 mL | Freq: Once | INTRAMUSCULAR | Status: AC | PRN
  Administered 2023-04-26: 100 mL via INTRAVENOUS

## 2023-04-26 MED ORDER — ONDANSETRON 4 MG PO TBDP: 4.0000 mg | ORAL_TABLET | Freq: Three times a day (TID) | ORAL | 0 refills | Status: DC | PRN

## 2023-04-26 MED ORDER — HYDROMORPHONE HCL 4 MG PO TABS: 4.0000 mg | ORAL_TABLET | ORAL | 0 refills | Status: DC | PRN

## 2023-04-26 MED ORDER — IBUPROFEN 400 MG PO TABS: 400.0000 mg | ORAL_TABLET | Freq: Four times a day (QID) | ORAL | 0 refills | Status: DC | PRN

## 2023-04-26 MED ORDER — KETOROLAC TROMETHAMINE 30 MG/ML IJ SOLN
30.0000 mg | Freq: Once | INTRAMUSCULAR | Status: AC
  Administered 2023-04-26: 30 mg via INTRAMUSCULAR
  Filled 2023-04-26: qty 1

## 2023-04-26 MED ORDER — HYDROCODONE-ACETAMINOPHEN 5-325 MG PO TABS
1.0000 | ORAL_TABLET | Freq: Once | ORAL | Status: AC
  Administered 2023-04-26: 1 via ORAL
  Filled 2023-04-26: qty 1

## 2023-04-26 NOTE — Discharge Instructions (Addendum)
Use the incentive spirometer every hour while awake.  You are being prescribed a narcotic pain medicine called Dilaudid. Do NOT take your tramadol while you are on this medication, you can resume using tramadol after you have finished this course.  You should also take your Voltaren and Tylenol to help treat your rib fractures.  Follow-up with your primary care physician.  If you develop fever, new or worsening or uncontrolled pain, trouble breathing, or any other new/concerning symptoms then return to the ER or call 911.

## 2023-04-26 NOTE — ED Provider Notes (Signed)
[facility] AT [facility]  Provider Note  CSN: [id number] Arrival date & time: 04/26/23 7829  History Chief Complaint  Patient presents with   Marletta Lor    [name] is a 57 y.o. female brought to the ED by husband for evaluation of L rib pain after she fell off a step stool onto the edge of her tub around 2100hrs. Complaining of persistent left rib pain, worse with movement and deep breath   Home Medications Prior to Admission medications   Medication Sig Start Date End Date Taking? Authorizing Provider  albuterol (PROVENTIL) (2.5 MG/3ML) 0.083% nebulizer solution Take 2.5 mg by nebulization every 6 (six) hours as needed for wheezing or shortness of breath.    [provider]  albuterol (VENTOLIN HFA) 108 (90 Base) MCG/ACT inhaler Inhale 1-2 puffs into the lungs every 6 (six) hours as needed for wheezing or shortness of breath.    [provider]  ALPRAZolam Prudy Feeler) 1 MG tablet Take 0.5-1 mg by mouth at bedtime as needed for anxiety.    [provider]  buPROPion (WELLBUTRIN XL) 150 MG 24 hr tablet Take 150 mg by mouth daily.    [provider]  diclofenac (VOLTAREN) 75 MG EC tablet Take 75 mg by mouth 2 (two) times daily.    [provider]  diclofenac Sodium (VOLTAREN) 1 % GEL Apply 1 Application topically 4 (four) times daily as needed (pain).    [provider]  esomeprazole (NEXIUM) 40 MG capsule Take 40 mg by mouth daily. 07/11/19   [provider]  hydrochlorothiazide (HYDRODIURIL) 25 MG tablet Take 25 mg by mouth daily. 01/01/19   [provider]  levocetirizine (XYZAL) 5 MG tablet Take 5 mg by mouth at bedtime. 07/23/19   [provider]  pregabalin (LYRICA) 50 MG capsule Take 150 mg by mouth 2 (two) times daily.    [provider]  simvastatin (ZOCOR) 20 MG tablet Take 20 mg by mouth daily. 02/07/22   [provider]  traMADol (ULTRAM) 50 MG tablet Take  50 mg by mouth 2 (two) times daily as needed for moderate pain.    [provider]     Allergies    Prednisone   Review of Systems   Review of Systems Please see HPI for pertinent positives and negatives  Physical Exam BP 121/75 (BP Location: Right Arm)   Pulse 69   Resp 20   Ht 5\' 5"  (1.651 m)   Wt 74.8 kg   SpO2 98%   BMI 27.46 kg/m   Physical Exam Vitals and nursing note reviewed.  Constitutional:      Appearance: Normal appearance.  HENT:     Head: Normocephalic and atraumatic.     Nose: Nose normal.     Mouth/Throat:     Mouth: Mucous membranes are moist.  Eyes:     Extraocular Movements: Extraocular movements intact.     Conjunctiva/sclera: Conjunctivae normal.  Cardiovascular:     Rate and Rhythm: Normal rate.  Pulmonary:     Effort: Pulmonary effort is normal.     Breath sounds: Normal breath sounds. No wheezing or rales.  Chest:     Chest wall: Tenderness (L lateral chest wall) present.  Abdominal:     General: Abdomen is flat.     Palpations: Abdomen is soft.     Tenderness: There is no abdominal tenderness.  Musculoskeletal:        General: No swelling. Normal range  of motion.     Cervical back: Neck supple.  Skin:    General: Skin is warm and dry.  Neurological:     General: No focal deficit present.     Mental Status: She is alert.  Psychiatric:        Mood and Affect: Mood normal.     ED Results / Procedures / Treatments   EKG None  Procedures Procedures  Medications Ordered in the ED Medications  HYDROcodone-acetaminophen (NORCO/VICODIN) 5-325 MG per tablet 1 tablet (1 tablet Oral Given 04/26/23 0456)  ketorolac (TORADOL) 30 MG/ML injection 30 mg (30 mg Intramuscular Given 04/26/23 0456)  HYDROmorphone (DILAUDID) injection 1 mg (1 mg Intravenous Given 04/26/23 0612)    Initial Impression and Plan  Patient here with L rib injury. Will give pain meds and send for xray.   ED Course   Clinical Course as of 04/26/23 8469   Wynelle Link Apr 26, 2023  6295 I personally viewed the images from radiology studies and awaiting radiologist interpretation: Rib xray shows at least 3 displaced rib fractures. Patient's pain not well controlled. Will place IV, check basic labs and send for CT to further quantify her injuries. Suspect she will require admission for pain control, but for now she would like to see how she does with IV pain meds before making that decision.  [CS]  0628 CBC with mild leukocytosis.  [CS]  [phone] CMP with mild hyponatremia, she has recently been on a 2 week diet with decreased oral intake.  [CS]  [phone] Care of the patient will be signed out to the oncoming team at the change of shift.  [CS]    Clinical Course User Index [CS] Pollyann Savoy, MD     MDM Rules/Calculators/A&P Medical Decision Making Problems Addressed: Closed fracture of multiple ribs of left side, initial encounter: acute illness or injury Fall, initial encounter: acute illness or injury  Amount and/or Complexity of Data Reviewed Labs: ordered. Decision-making details documented in ED Course. Radiology: ordered and independent interpretation performed. Decision-making details documented in ED Course.  Risk Prescription drug management. Parenteral controlled substances.     Final Clinical Impression(s) / ED Diagnoses Final diagnoses:  Fall, initial encounter  Closed fracture of multiple ribs of left side, initial encounter    Rx / DC Orders ED Discharge Orders     None        Pollyann Savoy, MD 04/26/23 [phone]

## 2023-04-26 NOTE — ED Triage Notes (Signed)
Pt states she fell off a step stool last night around 9pm and hit her L rib area on the edge of her garden tub. Pt with c/o L rib pain.

## 2023-04-26 NOTE — ED Provider Notes (Addendum)
Care transferred to me.  CT chest/abdomen/pelvis does not show any emergent pathology such as pneumothorax.  I personally viewed/interpreted these images.  There are rib fractures which were expected.  Patient's pain is a lot better controlled and she would like to go home.  Will treat with narcotic pain medicine, she is on tramadol but it has not helped so we will have her stop the tramadol for now, start oral hydromorphone, and continue her Voltaren.  Will make sure she uses Tylenol and given incentive spirometer.  We discussed return precautions but otherwise she appears well and prefers to go home.   Pricilla Loveless, MD 04/26/23 [phone]  Pharmacy called and they do not have po dilaudid. Will change to IR morphine (which they confirmed they do have). Rx resent.   Pricilla Loveless, MD 04/26/23 [phone]

## 2023-04-28 DIAGNOSIS — R809 Proteinuria, unspecified: Secondary | ICD-10-CM | POA: Diagnosis not present

## 2023-04-28 DIAGNOSIS — S2242XA Multiple fractures of ribs, left side, initial encounter for closed fracture: Secondary | ICD-10-CM | POA: Diagnosis not present

## 2023-04-28 DIAGNOSIS — F17210 Nicotine dependence, cigarettes, uncomplicated: Secondary | ICD-10-CM | POA: Diagnosis not present

## 2023-04-28 DIAGNOSIS — I10 Essential (primary) hypertension: Secondary | ICD-10-CM | POA: Diagnosis not present

## 2023-04-28 DIAGNOSIS — R7303 Prediabetes: Secondary | ICD-10-CM | POA: Diagnosis not present

## 2023-04-28 DIAGNOSIS — J189 Pneumonia, unspecified organism: Secondary | ICD-10-CM | POA: Diagnosis not present

## 2023-05-19 DIAGNOSIS — M25561 Pain in right knee: Secondary | ICD-10-CM | POA: Diagnosis not present

## 2023-05-19 DIAGNOSIS — M5416 Radiculopathy, lumbar region: Secondary | ICD-10-CM | POA: Diagnosis not present

## 2023-05-19 DIAGNOSIS — M461 Sacroiliitis, not elsewhere classified: Secondary | ICD-10-CM | POA: Diagnosis not present

## 2023-06-02 DIAGNOSIS — F411 Generalized anxiety disorder: Secondary | ICD-10-CM | POA: Diagnosis not present

## 2023-06-02 DIAGNOSIS — M5440 Lumbago with sciatica, unspecified side: Secondary | ICD-10-CM | POA: Diagnosis not present

## 2023-06-02 DIAGNOSIS — K219 Gastro-esophageal reflux disease without esophagitis: Secondary | ICD-10-CM | POA: Diagnosis not present

## 2023-06-02 DIAGNOSIS — I10 Essential (primary) hypertension: Secondary | ICD-10-CM | POA: Diagnosis not present

## 2023-06-02 DIAGNOSIS — Z791 Long term (current) use of non-steroidal anti-inflammatories (NSAID): Secondary | ICD-10-CM | POA: Diagnosis not present

## 2023-06-02 DIAGNOSIS — Z8249 Family history of ischemic heart disease and other diseases of the circulatory system: Secondary | ICD-10-CM | POA: Diagnosis not present

## 2023-06-02 DIAGNOSIS — Z9181 History of falling: Secondary | ICD-10-CM | POA: Diagnosis not present

## 2023-06-02 DIAGNOSIS — F17210 Nicotine dependence, cigarettes, uncomplicated: Secondary | ICD-10-CM | POA: Diagnosis not present

## 2023-06-02 DIAGNOSIS — J4489 Other specified chronic obstructive pulmonary disease: Secondary | ICD-10-CM | POA: Diagnosis not present

## 2023-06-02 DIAGNOSIS — E785 Hyperlipidemia, unspecified: Secondary | ICD-10-CM | POA: Diagnosis not present

## 2023-07-28 DIAGNOSIS — M5416 Radiculopathy, lumbar region: Secondary | ICD-10-CM | POA: Diagnosis not present

## 2023-07-29 DIAGNOSIS — R7303 Prediabetes: Secondary | ICD-10-CM | POA: Diagnosis not present

## 2023-07-29 DIAGNOSIS — E782 Mixed hyperlipidemia: Secondary | ICD-10-CM | POA: Diagnosis not present

## 2023-07-29 DIAGNOSIS — I10 Essential (primary) hypertension: Secondary | ICD-10-CM | POA: Diagnosis not present

## 2023-08-03 ENCOUNTER — Other Ambulatory Visit (HOSPITAL_COMMUNITY): Payer: Self-pay | Admitting: Nurse Practitioner

## 2023-08-03 DIAGNOSIS — F17210 Nicotine dependence, cigarettes, uncomplicated: Secondary | ICD-10-CM | POA: Diagnosis not present

## 2023-08-03 DIAGNOSIS — R7303 Prediabetes: Secondary | ICD-10-CM | POA: Diagnosis not present

## 2023-08-03 DIAGNOSIS — M545 Low back pain, unspecified: Secondary | ICD-10-CM | POA: Diagnosis not present

## 2023-08-03 DIAGNOSIS — E663 Overweight: Secondary | ICD-10-CM | POA: Diagnosis not present

## 2023-08-03 DIAGNOSIS — J449 Chronic obstructive pulmonary disease, unspecified: Secondary | ICD-10-CM | POA: Diagnosis not present

## 2023-08-03 DIAGNOSIS — K219 Gastro-esophageal reflux disease without esophagitis: Secondary | ICD-10-CM | POA: Diagnosis not present

## 2023-08-03 DIAGNOSIS — Z1231 Encounter for screening mammogram for malignant neoplasm of breast: Secondary | ICD-10-CM

## 2023-08-03 DIAGNOSIS — I10 Essential (primary) hypertension: Secondary | ICD-10-CM | POA: Diagnosis not present

## 2023-08-03 DIAGNOSIS — F411 Generalized anxiety disorder: Secondary | ICD-10-CM | POA: Diagnosis not present

## 2023-08-03 DIAGNOSIS — G8929 Other chronic pain: Secondary | ICD-10-CM | POA: Diagnosis not present

## 2023-08-03 DIAGNOSIS — E782 Mixed hyperlipidemia: Secondary | ICD-10-CM | POA: Diagnosis not present

## 2023-08-03 DIAGNOSIS — Z23 Encounter for immunization: Secondary | ICD-10-CM | POA: Diagnosis not present

## 2023-08-03 DIAGNOSIS — D692 Other nonthrombocytopenic purpura: Secondary | ICD-10-CM | POA: Diagnosis not present

## 2023-08-24 DIAGNOSIS — M5416 Radiculopathy, lumbar region: Secondary | ICD-10-CM | POA: Diagnosis not present

## 2023-08-25 DIAGNOSIS — M25561 Pain in right knee: Secondary | ICD-10-CM | POA: Diagnosis not present

## 2023-08-25 DIAGNOSIS — M461 Sacroiliitis, not elsewhere classified: Secondary | ICD-10-CM | POA: Diagnosis not present

## 2023-08-25 DIAGNOSIS — M5416 Radiculopathy, lumbar region: Secondary | ICD-10-CM | POA: Diagnosis not present

## 2023-09-04 DIAGNOSIS — M5416 Radiculopathy, lumbar region: Secondary | ICD-10-CM | POA: Diagnosis not present

## 2023-10-26 ENCOUNTER — Ambulatory Visit (HOSPITAL_COMMUNITY)
Admission: RE | Admit: 2023-10-26 | Discharge: 2023-10-26 | Disposition: A | Discharge status: Home / Self Care | Payer: 59 | Source: Ambulatory Visit | Attending: Nurse Practitioner | Admitting: Nurse Practitioner

## 2023-10-26 DIAGNOSIS — Z1231 Encounter for screening mammogram for malignant neoplasm of breast: Secondary | ICD-10-CM | POA: Diagnosis not present

## 2023-11-10 DIAGNOSIS — J209 Acute bronchitis, unspecified: Secondary | ICD-10-CM | POA: Diagnosis not present

## 2023-11-10 DIAGNOSIS — F17210 Nicotine dependence, cigarettes, uncomplicated: Secondary | ICD-10-CM | POA: Diagnosis not present

## 2023-11-30 DIAGNOSIS — S0502XA Injury of conjunctiva and corneal abrasion without foreign body, left eye, initial encounter: Secondary | ICD-10-CM | POA: Diagnosis not present

## 2023-12-09 DIAGNOSIS — M7989 Other specified soft tissue disorders: Secondary | ICD-10-CM | POA: Diagnosis not present

## 2023-12-31 DIAGNOSIS — R300 Dysuria: Secondary | ICD-10-CM | POA: Diagnosis not present

## 2023-12-31 DIAGNOSIS — N899 Noninflammatory disorder of vagina, unspecified: Secondary | ICD-10-CM | POA: Diagnosis not present

## 2024-01-27 DIAGNOSIS — E782 Mixed hyperlipidemia: Secondary | ICD-10-CM | POA: Diagnosis not present

## 2024-01-27 DIAGNOSIS — R7303 Prediabetes: Secondary | ICD-10-CM | POA: Diagnosis not present

## 2024-02-03 ENCOUNTER — Other Ambulatory Visit: Payer: Self-pay | Admitting: Nurse Practitioner

## 2024-02-03 DIAGNOSIS — Z122 Encounter for screening for malignant neoplasm of respiratory organs: Secondary | ICD-10-CM

## 2024-02-11 ENCOUNTER — Emergency Department (HOSPITAL_COMMUNITY)

## 2024-02-11 ENCOUNTER — Other Ambulatory Visit: Payer: Self-pay

## 2024-02-11 ENCOUNTER — Emergency Department (HOSPITAL_COMMUNITY)
Admission: EM | Admit: 2024-02-11 | Discharge: 2024-02-12 | Disposition: A | Discharge status: Home / Self Care | Attending: Emergency Medicine | Admitting: Emergency Medicine

## 2024-02-11 ENCOUNTER — Encounter (HOSPITAL_COMMUNITY): Payer: Self-pay

## 2024-02-11 DIAGNOSIS — S2242XA Multiple fractures of ribs, left side, initial encounter for closed fracture: Secondary | ICD-10-CM | POA: Diagnosis not present

## 2024-02-11 DIAGNOSIS — R1110 Vomiting, unspecified: Secondary | ICD-10-CM | POA: Insufficient documentation

## 2024-02-11 DIAGNOSIS — R569 Unspecified convulsions: Secondary | ICD-10-CM | POA: Insufficient documentation

## 2024-02-11 DIAGNOSIS — R464 Slowness and poor responsiveness: Secondary | ICD-10-CM | POA: Insufficient documentation

## 2024-02-11 DIAGNOSIS — R0602 Shortness of breath: Secondary | ICD-10-CM | POA: Diagnosis not present

## 2024-02-11 DIAGNOSIS — R918 Other nonspecific abnormal finding of lung field: Secondary | ICD-10-CM | POA: Diagnosis not present

## 2024-02-11 DIAGNOSIS — R404 Transient alteration of awareness: Secondary | ICD-10-CM

## 2024-02-11 DIAGNOSIS — W19XXXA Unspecified fall, initial encounter: Secondary | ICD-10-CM | POA: Diagnosis not present

## 2024-02-11 DIAGNOSIS — R4182 Altered mental status, unspecified: Secondary | ICD-10-CM | POA: Diagnosis not present

## 2024-02-11 DIAGNOSIS — J9811 Atelectasis: Secondary | ICD-10-CM | POA: Diagnosis not present

## 2024-02-11 LAB — RAPID URINE DRUG SCREEN, HOSP PERFORMED
Amphetamines: NOT DETECTED
Barbiturates: NOT DETECTED
Benzodiazepines: POSITIVE — AB
Cocaine: NOT DETECTED
Opiates: NOT DETECTED
Tetrahydrocannabinol: POSITIVE — AB

## 2024-02-11 LAB — COMPREHENSIVE METABOLIC PANEL WITH GFR
ALT: 17 U/L (ref 0–44)
AST: 14 U/L — ABNORMAL LOW (ref 15–41)
Albumin: 4 g/dL (ref 3.5–5.0)
Alkaline Phosphatase: 81 U/L (ref 38–126)
Anion gap: 11 (ref 5–15)
BUN: 11 mg/dL (ref 6–20)
CO2: 24 mmol/L (ref 22–32)
Calcium: 9.2 mg/dL (ref 8.9–10.3)
Chloride: 97 mmol/L — ABNORMAL LOW (ref 98–111)
Creatinine, Ser: 0.85 mg/dL (ref 0.44–1.00)
GFR, Estimated: >60 mL/min (ref >60)
Glucose, Bld: 130 mg/dL — ABNORMAL HIGH (ref 70–99)
Potassium: 3.5 mmol/L (ref 3.5–5.1)
Sodium: 132 mmol/L — ABNORMAL LOW (ref 135–145)
Total Bilirubin: 0.6 mg/dL (ref 0.0–1.2)
Total Protein: 6.9 g/dL (ref 6.5–8.1)

## 2024-02-11 LAB — DIFFERENTIAL
Abs Immature Granulocytes: 0.04 K/uL (ref 0.00–0.07)
Basophils Absolute: 0.1 K/uL (ref 0.0–0.1)
Basophils Relative: 1 %
Eosinophils Absolute: 0.2 K/uL (ref 0.0–0.5)
Eosinophils Relative: 2 %
Immature Granulocytes: 0 %
Lymphocytes Relative: 19 %
Lymphs Abs: 2.2 K/uL (ref 0.7–4.0)
Monocytes Absolute: 0.9 K/uL (ref 0.1–1.0)
Monocytes Relative: 8 %
Neutro Abs: 7.8 K/uL — ABNORMAL HIGH (ref 1.7–7.7)
Neutrophils Relative %: 70 %

## 2024-02-11 LAB — CBC
HCT: 38.3 % (ref 36.0–46.0)
Hemoglobin: 13.2 g/dL (ref 12.0–15.0)
MCH: 33.4 pg (ref 26.0–34.0)
MCHC: 34.5 g/dL (ref 30.0–36.0)
MCV: 97 fL (ref 80.0–100.0)
Platelets: 298 K/uL (ref 150–400)
RBC: 3.95 MIL/uL (ref 3.87–5.11)
RDW: 13.2 % (ref 11.5–15.5)
WBC: 11.1 K/uL — ABNORMAL HIGH (ref 4.0–10.5)
nRBC: 0 % (ref 0.0–0.2)

## 2024-02-11 LAB — APTT: aPTT: 28 s (ref 24–36)

## 2024-02-11 LAB — PROTIME-INR
INR: 1 (ref 0.8–1.2)
Prothrombin Time: 14.3 s (ref 11.4–15.2)

## 2024-02-11 LAB — CBG MONITORING, ED: Glucose-Capillary: 122 mg/dL — ABNORMAL HIGH (ref 70–99)

## 2024-02-11 LAB — ETHANOL: Alcohol, Ethyl (B): <15 mg/dL (ref <15)

## 2024-02-11 MED ORDER — IOHEXOL 350 MG/ML SOLN
75.0000 mL | Freq: Once | INTRAVENOUS | Status: AC | PRN
  Administered 2024-02-11: 75 mL via INTRAVENOUS

## 2024-02-11 NOTE — Discharge Instructions (Signed)
 You were seen in the ER for unresponsive episode.  It is unclear what caused it.  One of the possibilities we have considered is seizure-like activity.  Other possibility includes medication side effects or drug side effects.  At this time, we recommend that you follow-up with neurologist as an outpatient to get further studies to rule out seizures.  [place] law prevents people with seizures or fainting from driving or operating dangerous machinery until they are free of seizures or fainting for 6 months.

## 2024-02-11 NOTE — Consult Note (Signed)
 [facility]  Audio Consult Due to a technical failure, video communication was unavailable during this encounter. The consultation was conducted via audio-only (telephone) communication in coordination with on-site clinical staff. Neurologic assessment was limited to findings relayed by the bedside team. I provided guidance based on real-time discussion with the staff and clinical judgment under the constraints of the technology limitations.  Patient Name:   [name], [name] Date of Birth:   04-04-1966 Identification Number:   MRN - [id number]  Differential Diagnosis (based on reported history and exam):       R41.89 - Unresponsive  Assessment and Plan: Patient is a 58 yo female with pmhx pneumonia, occipital nerualgia, tobacco use, EtOH use, THC use who presented with episode of unresponsiveness. This occured in the setting of EtOH, THC use. Unclear if intoxication vs seizure vs less likely stroke vs alternative etiologies. CT head and CTA head and neck are pending. If LVO is identified, ED provider will need to engage neuro IR on call immediately. Need to r/o other metabolic or infectious derangements. which may be contributing. Patient is on several central acting medications which is concerning. Tramadol and Wellbutrin are known to potentially lower the seizure threshold, especially in the setting of THC and EtOH use.   Further work-up and eval per ED. Infectious, metabolic, cardiac work-up is recommended. ABC per primary team. EtOH, UDS is pending as well. Normotension and euglycemia for now. MRI brain and EEG recommended when able. If no improvement in the ED, may need transfer for cEEG monitoring. Consider LP if no further improvement as well. PT/OT/ST eval when able to tolerate. Other management and dispo per ED.   Discussed with ED provider via telephone as patient was not seen due to failure of the video carts, significantly limiting  assessment and plan.  HPI (Provided by ED Team): Patient is a 58 yo female with pmhx pneumonia, occipital nerualgia, tobacco use, EtOH use, THC use who presented with episode of unresponsiveness. She was with friend of 5 years. She ate around 4 pm, started drinking EtOH and split a joint. She then stopped talking and patient became unresponsive, vomited, with labored breathing. She is on Tramadol, Xanax, Gabapentin, Wellbutrin. Exam findings (Reported by the ED team): Patient is blinking her eyes, has no tone or gag. Test/Imaging results (Reported by the ED team or reviewed in EMR): CT and labs pending Glucose 122 Consider thrombolytic if no contraindications identified by ED: No CTA / Advanced imaging recommended: Yes If LVO is identified, the ED team will contact NIR.   Dr Mabel Collin   [facility] For Inpatient follow-up with [facility] physician please call [facility] at [phone]. As we are not an outpatient service for any post hospital discharge needs please contact the hospital for assistance.  If you have any questions for the [facility] physicians or need to reconsult for clinical or diagnostic changes please contact us  via [facility] at [phone].   Signature : Mabel Collin

## 2024-02-11 NOTE — ED Triage Notes (Signed)
 [facility] from home. Cc of being unresponsive. Ems was called out after patient and husband were drinking and smoking marijuana. Per ems last time she smoked was at the beginning of the year and smoked delta 8.  Patient arrives with eyes closed not responding to painful stimuli.  Vitals stable with em s. 134/80 HR 64. 94% on room air. CBG 93. 20g l ac placed by ems.    Phone number to husband who will not be coming up here per ems.   [phone] husband

## 2024-02-11 NOTE — ED Provider Notes (Signed)
 [place] EMERGENCY DEPARTMENT AT [facility] Provider Note   CSN: [id number] Arrival date & time: 02/11/24  8057  An emergency department physician performed an initial assessment on this suspected stroke Mccarty at 23.  Mccarty presents with: [name] is a 58 y.o. female.   HPI     [name] comes in with chief complaint of acute unresponsiveness. I called Mccarty's friend, who called 911 to get collateral history.  According to the Mccarty's friend, Mccarty returned from work around 330 or 4.  [name] had not eaten anything, therefore they purchased some Pasta and had that.  Thereafter, they started drinking alcohol.  Mccarty had perhaps a beer and a half.  They also split a blunt.  They were laughing and talking, but suddenly Mccarty became silent.  After 2 to 3 minutes, the friend checked on her and noted that Mccarty had slumped forward and was unresponsive.  [name] had also vomited.  Mccarty was not breathing right, therefore he called 911.  EMS was called within 5 to 10 minutes of him being alarmed.  EMS arrived right away.  Per EMS, Mccarty was not responding to any stimuli.  There was no seizure-like activity noted by EMS or the friend.  Mccarty has no history of seizures.  The friend indicates that Mccarty does not abuse any drugs and did not take any pills that he saw.  Mccarty arrives to the ER with GCS of 6, 4 for spontaneously opened eye.  [name] is not responding to any stimuli verbal or noxious.  Prior to Admission medications   Medication Sig Start Date End Date Taking? Authorizing Provider  albuterol  (PROVENTIL ) (2.5 MG/3ML) 0.083% nebulizer solution Take 2.5 mg by nebulization every 6 (six) hours as needed for wheezing or shortness of breath.    [provider]  albuterol  (VENTOLIN  HFA) 108 (90 Base) MCG/ACT inhaler Inhale 1-2 puffs into the lungs every 6 (six) hours as needed for wheezing or shortness of breath.    [provider]  ALPRAZolam (XANAX) 1 MG tablet Take 0.5-1 mg by mouth at bedtime as needed for anxiety.    [provider]  buPROPion (WELLBUTRIN SR) 150 MG 12 hr tablet Take 150 mg by mouth 2 (two) times daily. 08/10/23   [provider]  diclofenac (VOLTAREN) 75 MG EC tablet Take 75 mg by mouth 2 (two) times daily.    [provider]  diclofenac Sodium (VOLTAREN) 1 % GEL Apply 1 Application topically 4 (four) times daily as needed (pain).    [provider]  esomeprazole (NEXIUM) 40 MG capsule Take 40 mg by mouth daily. 07/11/19   [provider]  hydrochlorothiazide (HYDRODIURIL) 25 MG tablet Take 25 mg by mouth daily. 01/01/19   [provider]  levocetirizine (XYZAL) 5 MG tablet Take 5 mg by mouth at bedtime. 07/23/19   [provider]  morphine  (MSIR) 15 MG tablet Take 1 tablet (15 mg total) by mouth every 4 (four) hours as needed for severe pain (pain score 7-10). 04/26/23   Freddi Hamilton, MD  ondansetron  (ZOFRAN -ODT) 4 MG disintegrating tablet Take 1 tablet (4 mg total) by mouth every 8 (eight) hours as needed for nausea or vomiting. 04/26/23   Freddi Hamilton, MD  pregabalin (LYRICA) 150 MG capsule Take 150 mg by mouth 2 (two) times daily.    [provider]  simvastatin (ZOCOR) 20 MG tablet Take 20 mg by mouth daily. 02/07/22   [provider]  traMADol (ULTRAM) 50 MG tablet Take 50 mg by mouth 2 (two) times daily as needed for moderate pain.    [provider]  DOMINIC BECK 200-62.5-25 MCG/ACT AEPB Take 1 puff by mouth daily. 02/01/24   [provider]    Allergies: Prednisone     Review of Systems  All other systems reviewed and are negative.   Updated Vital Signs BP (!) 155/82   Pulse 66   Temp (!) 95.7 F (35.4 C) (Rectal)   Resp 14   Ht 5' 5 (1.651 m)   Wt 75 kg   SpO2 93%   BMI 27.51 kg/m   Physical Exam Vitals and nursing note reviewed.  Constitutional:       Appearance: [name] is well-developed. [name] is not diaphoretic.  HENT:     Head: Atraumatic.  Eyes:     Comments: Pupils are 4 mm, equal  Neck:     Comments: No gag reflex, Mccarty's jaw is clenched Cardiovascular:     Rate and Rhythm: Normal rate.  Pulmonary:     Effort: Pulmonary effort is normal.  Musculoskeletal:     Cervical back: Neck supple.  Skin:    General: Skin is warm and dry.  Neurological:     Comments: No tone noted over upper or lower extremity.  No rigidity.  2+ patellar reflex     (all labs ordered are listed, but only abnormal results are displayed) Labs Reviewed  CBC - Abnormal; Notable for the following components:      Result Value   WBC 11.1 (*)    All other components within normal limits  COMPREHENSIVE METABOLIC PANEL WITH GFR - Abnormal; Notable for the following components:   Sodium 132 (*)    Chloride 97 (*)    Glucose, Bld 130 (*)    AST 14 (*)    All other components within normal limits  RAPID URINE DRUG SCREEN, HOSP PERFORMED - Abnormal; Notable for the following components:   Benzodiazepines POSITIVE (*)    Tetrahydrocannabinol POSITIVE (*)    All other components within normal limits  DIFFERENTIAL - Abnormal; Notable for the following components:   Neutro Abs 7.8 (*)    All other components within normal limits  CBG MONITORING, ED - Abnormal; Notable for the following components:   Glucose-Capillary 122 (*)    All other components within normal limits  ETHANOL  PROTIME-INR  APTT    EKG: None  Radiology: [facility] Chest Port 1 View Result Date: 02/11/2024 CLINICAL DATA:  Altered mental status sent to evaluate for aspiration. EXAM: PORTABLE CHEST 1 VIEW COMPARISON:  April 26, 2023 FINDINGS: The heart size and mediastinal contours are within normal limits. Chronic appearing increased interstitial lung markings are seen. Mild atelectasis is noted within the lateral aspect of the right lung base. No focal consolidation, pleural effusion or  pneumothorax is identified. There are multiple chronic left-sided rib fractures. IMPRESSION: Chronic appearing increased interstitial lung markings with mild right basilar atelectasis. Electronically Signed   By: Suzen Dials M.D.   On: 02/11/2024 21:17   CT HEAD CODE STROKE WO CONTRAST Result Date: 02/11/2024 CLINICAL DATA:  Initial evaluation for acute neuro deficit, stroke suspected. EXAM: CT HEAD WITHOUT CONTRAST CT ANGIOGRAPHY HEAD AND NECK TECHNIQUE: Multidetector CT imaging of the head and neck was performed using the standard protocol during bolus administration of intravenous contrast. Multiplanar CT image reconstructions and MIPs were obtained to evaluate the vascular anatomy. Carotid stenosis measurements (when applicable) are obtained utilizing NASCET criteria,  using the distal internal carotid diameter as the denominator. RADIATION DOSE REDUCTION: This exam was performed according to the departmental dose-optimization program which includes automated exposure control, adjustment of the mA and/or kV according to Mccarty size and/or use of iterative reconstruction technique. CONTRAST:  75mL OMNIPAQUE  IOHEXOL  350 MG/ML SOLN COMPARISON:  Comparison made with prior study from 10/11/2002. FINDINGS: CT HEAD FINDINGS Brain: Cerebral volume within normal limits. No acute intracranial hemorrhage. No acute large vessel territory infarct. No mass lesion, midline shift or mass effect no hydrocephalus or extra-axial fluid collection. Vascular: No abnormal hyperdense vessel. Scattered vascular calcifications noted within the carotid siphons. Skull: Scalp soft tissues within normal limits.  Calvarium intact. Sinuses/Orbits: Globes and orbital soft tissues within normal limits. Paranasal sinuses and mastoid air cells are largely clear. Other: None. Review of the MIP images confirms the above findings CTA NECK FINDINGS Aortic arch: Visualized aortic arch within normal limits for caliber with standard branch  pattern. Aortic atherosclerosis. No significant stenosis about the origin the great vessels. Right carotid system: Right common and internal carotid arteries are tortuous but patent without dissection. Mild atheromatous change about the right carotid bulb without hemodynamically significant greater than 50% stenosis. 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 Left carotid system: Left common and internal carotid arteries are mildly tortuous with patent without dissection. Atheromatous change about the left carotid bulb without hemodynamically significant stenosis. Focal tortuosity with fusiform annual dilatation of the distal cervical left ICA measuring up to 8 mm (series 5, image 103). Vertebral arteries: Both vertebral arteries arise from subclavian arteries. Vertebral arteries patent without stenosis or dissection. Skeleton: No worrisome osseous lesions. Moderate cervical spondylosis at C4-5 through C6-7. Other neck: No other acute finding. Minimal secretions noted within the subglottic airway. Upper chest: Emphysema. No other acute finding. Multiple chronic left-sided rib fractures noted. Review of the MIP images confirms the above findings CTA HEAD FINDINGS Anterior circulation: Both internal carotid arteries are patent through the siphons without stenosis or other abnormality. A1 segments patent bilaterally. Normal anterior communicating complex. Anterior cerebral arteries patent without significant stenosis. No M1 stenosis or occlusion. No proximal MCA branch occlusion. Distal MCA branches perfused and symmetric. Posterior circulation: Both V4 segments patent without significant stenosis. Left PICA patent. Right PICA not seen. Basilar patent without stenosis. Superior cerebral arteries patent bilaterally. Left PCA supplied via the basilar. Fetal type origin of the right PCA. Both PCAs patent to their distal  aspects without stenosis. Venous sinuses: Patent allowing for timing the contrast bolus. Anatomic variants: As above.  No aneurysm. Review of the MIP images confirms the above findings IMPRESSION: CT HEAD: No acute intracranial abnormality. CTA HEAD AND NECK: 1. Negative CTA for large vessel occlusion or other emergent finding. 2. Mild atheromatous change about the carotid bifurcations without hemodynamically significant stenosis. 3. 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 4. Aortic Atherosclerosis (ICD10-I70.0) and Emphysema (ICD10-J43.9). Results were called by telephone at the time of interpretation on 02/11/2024 at 8:25 pm to provider [facility] , who verbally acknowledged these results. Electronically Signed   By: Morene Hoard M.D.   On: 02/11/2024 20:34   CT ANGIO HEAD NECK W WO CM (CODE STROKE) Result Date: 02/11/2024 CLINICAL DATA:  Initial evaluation for acute neuro deficit, stroke suspected. EXAM: CT HEAD WITHOUT CONTRAST CT ANGIOGRAPHY HEAD AND NECK TECHNIQUE:  Multidetector CT imaging of the head and neck was performed using the standard protocol during bolus administration of intravenous contrast. Multiplanar CT image reconstructions and MIPs were obtained to evaluate the vascular anatomy. Carotid stenosis measurements (when applicable) are obtained utilizing NASCET criteria, using the distal internal carotid diameter as the denominator. RADIATION DOSE REDUCTION: This exam was performed according to the departmental dose-optimization program which includes automated exposure control, adjustment of the mA and/or kV according to Mccarty size and/or use of iterative reconstruction technique. CONTRAST:  75mL OMNIPAQUE  IOHEXOL  350 MG/ML SOLN COMPARISON:  Comparison made with prior study from 10/11/2002. FINDINGS: CT HEAD FINDINGS Brain: Cerebral volume within normal limits. No acute intracranial  hemorrhage. No acute large vessel territory infarct. No mass lesion, midline shift or mass effect no hydrocephalus or extra-axial fluid collection. Vascular: No abnormal hyperdense vessel. Scattered vascular calcifications noted within the carotid siphons. Skull: Scalp soft tissues within normal limits.  Calvarium intact. Sinuses/Orbits: Globes and orbital soft tissues within normal limits. Paranasal sinuses and mastoid air cells are largely clear. Other: None. Review of the MIP images confirms the above findings CTA NECK FINDINGS Aortic arch: Visualized aortic arch within normal limits for caliber with standard branch pattern. Aortic atherosclerosis. No significant stenosis about the origin the great vessels. Right carotid system: Right common and internal carotid arteries are tortuous but patent without dissection. Mild atheromatous change about the right carotid bulb without hemodynamically significant greater than 50% stenosis. 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 Left carotid system: Left common and internal carotid arteries are mildly tortuous with patent without dissection. Atheromatous change about the left carotid bulb without hemodynamically significant stenosis. Focal tortuosity with fusiform annual dilatation of the distal cervical left ICA measuring up to 8 mm (series 5, image 103). Vertebral arteries: Both vertebral arteries arise from subclavian arteries. Vertebral arteries patent without stenosis or dissection. Skeleton: No worrisome osseous lesions. Moderate cervical spondylosis at C4-5 through C6-7. Other neck: No other acute finding. Minimal secretions noted within the subglottic airway. Upper chest: Emphysema. No other acute finding. Multiple chronic left-sided rib fractures noted. Review of the MIP images confirms the above findings CTA HEAD FINDINGS Anterior circulation: Both internal  carotid arteries are patent through the siphons without stenosis or other abnormality. A1 segments patent bilaterally. Normal anterior communicating complex. Anterior cerebral arteries patent without significant stenosis. No M1 stenosis or occlusion. No proximal MCA branch occlusion. Distal MCA branches perfused and symmetric. Posterior circulation: Both V4 segments patent without significant stenosis. Left PICA patent. Right PICA not seen. Basilar patent without stenosis. Superior cerebral arteries patent bilaterally. Left PCA supplied via the basilar. Fetal type origin of the right PCA. Both PCAs patent to their distal aspects without stenosis. Venous sinuses: Patent allowing for timing the contrast bolus. Anatomic variants: As above.  No aneurysm. Review of the MIP images confirms the above findings IMPRESSION: CT HEAD: No acute intracranial abnormality. CTA HEAD AND NECK: 1. Negative CTA for large vessel occlusion or other emergent finding. 2. Mild atheromatous change about the carotid bifurcations without hemodynamically significant stenosis. 3. 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 4. Aortic Atherosclerosis (ICD10-I70.0) and Emphysema (ICD10-J43.9). Results were called by telephone at the time of interpretation on 02/11/2024 at 8:25 pm to provider [facility] , who verbally acknowledged these results. Electronically Signed   By: Morene  Nicholes M.D.   On: 02/11/2024 20:34     .Critical Care  Performed by: Charlyn Sora, MD Authorized by: Charlyn Sora, MD   Critical care provider statement:    Critical care time (minutes):  48   Critical care was necessary to treat or prevent imminent or life-threatening deterioration of the following conditions:  CNS failure or compromise   Critical care was time spent personally by me on the following activities:  Development of treatment plan with Mccarty or  surrogate, discussions with consultants, evaluation of Mccarty's response to treatment, examination of Mccarty, ordering and review of laboratory studies, ordering and review of radiographic studies, ordering and performing treatments and interventions, pulse oximetry, re-evaluation of Mccarty's condition, review of old charts and obtaining history from Mccarty or surrogate    Medications Ordered in the ED  iohexol  (OMNIPAQUE ) 350 MG/ML injection 75 mL (75 mLs Intravenous Contrast Given 02/11/24 2010)                                    Medical Decision Making Amount and/or Complexity of Data Reviewed Labs: ordered. Radiology: ordered.  Risk Prescription drug management.   58 year old Mccarty comes in with chief complaint of unresponsiveness.  Collateral history.  By EMS and Mccarty's friend, who called 911.  I also reviewed [place]  controlled substance database and care everywhere.  Mccarty is on Xanax, tramadol, Wellbutrin and pregabalin.  Differential diagnosis considered for this Mccarty includes: ICH / Stroke -specifically, basilar stroke, seizure, Infection - UTI/Pneumonia/soft tissue infection leading to encephalopathy, encephalopathy due to electrolyte abnormality or drug interactions or toxins or metabolic conditions like adrenal insufficiency, hyperglycemia,  Hypercapnia / COPD, Hypoxia  Based on initial assessment, higher suspicion for basilar insufficiency, seizure, polypharmacy. Code stroke activated.  Plan is to get basic labs and reassess the Mccarty after the imaging.  Reassessment: I have independently reviewed the following labs: Metabolic profile is normal.  Glucose is normal.  No leukocytosis.  Ethanol level came back normal.  I have independently reviewed the following imaging: CT scan of the brain, no evidence of brain bleed.  Mccarty was also reassessed at 9 PM, Mccarty is becoming slightly more alert.  [name] is answering questions.  [name] is moving her arms  and legs.  P.o. challenge initiated.  Mccarty reassured.  [name] does not remember being brought to the ER.  [name] remembers being with her friend [place].  [name] does not remember our first assessment.  Higher suspicion now that Mccarty likely had a seizure like activity, but polypharmacy remains as a possibility.  Teleneuro unable to do a video evaluation, therefore we discussed Mccarty's CT findings over the phone along with her presenting symptoms.  Given that Mccarty does not have any basilar obstruction, they are favoring alternative diagnosis, and recommend transferring to [facility] if Mccarty does not return to baseline.  However, on repeat assessment, Mccarty's symptoms have improved.  If [name] continues to improve, then we anticipate discharge with outpatient neuro follow-up.  Reassessment at 11 PM.: Mccarty has not ambulated, [name] has passed oral challenge.  We will likely discharge her. The Mccarty appears reasonably screened and/or stabilized for discharge and I doubt any other medical condition or other [facility] requiring further screening, evaluation, or treatment in the ED at this time prior to discharge.  Given the ambiguity with her symptoms,  we will let outpatient neuro team assess her.   Results from the ER workup  discussed with the Mccarty face to face and all questions answered to the best of my ability. The Mccarty is safe for discharge with strict return precautions.  Final diagnoses:  Seizure-like activity [facility])  Episode of unresponsiveness    ED Discharge Orders     None          Charlyn Sora, MD 02/11/24 [phone]

## 2024-02-11 NOTE — ED Notes (Signed)
 Eating drinking then suddenly stopped family immediately called at 650pm

## 2024-02-11 NOTE — ED Notes (Signed)
 CODE STROKE PAGED PER DR NANAVATI

## 2024-02-11 NOTE — ED Notes (Signed)
 Pt walked independently around nursing station with me a her side. Pt stated a little dizziness upon standing but on other issues. Pt given water with MD approval and RN at bedside for ( swallow screen)

## 2024-02-16 DIAGNOSIS — R569 Unspecified convulsions: Secondary | ICD-10-CM | POA: Diagnosis not present

## 2024-02-17 ENCOUNTER — Telehealth: Payer: Self-pay | Admitting: Neurology

## 2024-02-17 NOTE — Telephone Encounter (Signed)
 Patient LVM at 11:20 am for a call back to verify appointment date and time. Called patient to verify appointment.

## 2024-02-23 ENCOUNTER — Ambulatory Visit: Admitting: Neurology

## 2024-02-23 ENCOUNTER — Encounter: Payer: Self-pay | Admitting: Neurology

## 2024-02-23 VITALS — BP 126/78 | HR 62 | Ht 63.0 in | Wt 160.8 lb

## 2024-02-23 DIAGNOSIS — R404 Transient alteration of awareness: Secondary | ICD-10-CM | POA: Insufficient documentation

## 2024-02-23 DIAGNOSIS — M542 Cervicalgia: Secondary | ICD-10-CM

## 2024-02-23 DIAGNOSIS — G44031 Episodic paroxysmal hemicrania, intractable: Secondary | ICD-10-CM | POA: Diagnosis not present

## 2024-02-23 NOTE — Progress Notes (Signed)
 [facility]  Provider:  Dr [name] Referring Provider: Charlyn Sora, MD Primary Care Physician:  [name] PEDLAR, MD  Chief Complaint  Patient presents with   Headache    RM 2, Pt w/sister, referred here by PCP for unresponsiveness/possible seizure - went to ED 8/28. Pt denies any issues since then. States nagging intermittent HA - will alternate ibuprofen  and tylenol  and that works. Pt last seen at [facility] 2021 for HA.    HPI:  [name] is a 58 y.o. female and seen here on 02/23/2024 upon referral from  ED Dr. Charlyn for a Consultation/ Evaluation of loss of awareness,   The patient had come home from work and by 4 Pm had not yet eaten but shares a drink or 2 with her room mate- unresponsive and limp.  She vomited twice all over the deck, but was unresponsive- seated  in a patio chair.  Could not be aroused.  Had marijuana 3 puffs,   She was out of awareness and EMS came and brought to the ED.  Tox screen positive for THC, ETOH and benzos; she has a prescription for xanax.  She has been on wellbutrin bid. CT  was negative.  .  ED report: 58 year old patient comes in with chief complaint of acute unresponsiveness. I called patient's friend, who called 911 to get collateral history.   According to the patient's friend, patient returned from work around 330 or 4.  She had not eaten anything, therefore they purchased some Pasta and had that.  Thereafter, they started drinking alcohol.  Patient had perhaps a beer and a half.  They also split a blunt.  They were laughing and talking, but suddenly patient became silent.  After 2 to 3 minutes, the friend checked on her and noted that patient had slumped forward and was unresponsive.  She had also vomited.  Patient was not breathing right, therefore he called 911.  EMS was called within 5 to 10 minutes of him being alarmed.  EMS arrived right away.  Per EMS, patient was not responding to any stimuli.  There was no seizure-like  activity noted by EMS or the friend.  Patient has no history of seizures.  The friend indicates that patient does not abuse any drugs and did not take any pills that he saw.   Patient arrives to the ER with GCS of 6, 4 for spontaneously opened eye.  She is not responding to any stimuli , be it verbal or noxious.   Review of Systems: Out of a complete 14 system review, the patient complains of only the following symptoms, and all other reviewed systems are negative.  Pt w/sister, referred here by PCP for unresponsiveness went to ED 8/28. Pt denies any issues since then. Pt last seen at [facility] 2021 for HA.     Social History   Socioeconomic History   Marital status: Significant Other    Spouse name: Not on file   Number of children: 0   Years of education: 34 th   Highest education level: Not on file  Occupational History    Comment: [employer]  Tobacco Use   Smoking status: Every Day    Current packs/day: 0.50    Types: Cigarettes   Smokeless tobacco: Never   Tobacco comments:    Quit 2016, 03/2018 smoking  Vaping Use   Vaping status: Never Used  Substance and Sexual Activity   Alcohol use: Yes    Alcohol/week: 5.0 standard drinks of  alcohol    Types: 5 Cans of beer per week    Comment: 5 beers on average/wk   Drug use: Yes    Types: Marijuana    Comment: only on occasion   Sexual activity: Yes    Birth control/protection: Post-menopausal  Other Topics Concern   Not on file  Social History Narrative   Patient lives at home with her husband [name]). 04/15/18 separated      Patient is unemployed. Works part time at a [employer].   Education 12 th    Right handed.   Caffeine two cups of coffee daily.   Social Drivers of Corporate investment banker Strain: Low Risk  (09/29/2019)   Overall Financial Resource Strain (CARDIA)    Difficulty of Paying Living Expenses: Not very hard  Food Insecurity: No Food Insecurity (11/27/2020)   Received from [facility]    Hunger Vital Sign    Within the past 12 months, you worried that your food would run out before you got the money to buy more.: Never true    Within the past 12 months, the food you bought just didn't last and you didn't have money to get more.: Never true  Transportation Needs: No Transportation Needs (09/29/2019)   PRAPARE - Administrator, Civil Service (Medical): No    Lack of Transportation (Non-Medical): No  Physical Activity: Insufficiently Active (09/29/2019)   Exercise Vital Sign    Days of Exercise per Week: 2 days    Minutes of Exercise per Session: 60 min  Stress: Stress Concern Present (09/29/2019)   [employer] of Occupational Health - Occupational Stress Questionnaire    Feeling of Stress : To some extent  Social Connections: Unknown (10/29/2021)   Received from [facility]   Social Network    Social Network: Not on file  Intimate Partner Violence: Unknown (09/20/2021)   Received from [facility]   HITS    Physically Hurt: Not on file    Insult or Talk Down To: Not on file    Threaten Physical Harm: Not on file    Scream or Curse: Not on file    Family History  Problem Relation Age of Onset   Anxiety disorder Sister    Anxiety disorder Sister    Breast cancer Maternal Aunt    Hypertension Brother     Past Medical History:  Diagnosis Date   Adjustment disorder with mixed anxiety and depressed mood    Bronchitis due to tobacco use    Cervicalgia    Cervico-occipital neuralgia    Headache(784.0)    Pleuritis    Pneumonia due to adenovirus    PONV (postoperative nausea and vomiting)     Past Surgical History:  Procedure Laterality Date   COLONOSCOPY WITH PROPOFOL  N/A 03/17/2022   Procedure: COLONOSCOPY WITH PROPOFOL ;  Surgeon: Cindie Carlin POUR, DO;  Location: [facility];  Service: Endoscopy;  Laterality: N/A;  9:30am, asa 2   FRACTURE SURGERY     GANGLION CYST EXCISION     left hand wrist   HEMORRHOID SURGERY      Current  Outpatient Medications  Medication Sig Dispense Refill   albuterol  (VENTOLIN  HFA) 108 (90 Base) MCG/ACT inhaler Inhale 1-2 puffs into the lungs every 6 (six) hours as needed for wheezing or shortness of breath.     ALPRAZolam (XANAX) 1 MG tablet Take 0.5-1 mg by mouth at bedtime as needed for anxiety.     buPROPion (WELLBUTRIN SR) 150  MG 12 hr tablet Take 150 mg by mouth 2 (two) times daily.     diclofenac (VOLTAREN) 75 MG EC tablet Take 75 mg by mouth 2 (two) times daily.     diclofenac Sodium (VOLTAREN) 1 % GEL Apply 1 Application topically 4 (four) times daily as needed (pain).     esomeprazole (NEXIUM) 40 MG capsule Take 40 mg by mouth daily.     hydrochlorothiazide (HYDRODIURIL) 25 MG tablet Take 25 mg by mouth daily.     pregabalin (LYRICA) 150 MG capsule Take 150 mg by mouth 2 (two) times daily.     simvastatin (ZOCOR) 20 MG tablet Take 20 mg by mouth daily.     traMADol (ULTRAM) 50 MG tablet Take 50 mg by mouth 2 (two) times daily as needed for moderate pain.     TRELEGY ELLIPTA 200-62.5-25 MCG/ACT AEPB Take 1 puff by mouth daily.     albuterol  (PROVENTIL ) (2.5 MG/3ML) 0.083% nebulizer solution Take 2.5 mg by nebulization every 6 (six) hours as needed for wheezing or shortness of breath.     levocetirizine (XYZAL) 5 MG tablet Take 5 mg by mouth at bedtime.     Current Facility-Administered Medications  Medication Dose Route Frequency Provider Last Rate Last Admin   betamethasone  acetate-betamethasone  sodium phosphate (CELESTONE ) injection 12 mg  12 mg Intramuscular Once Sophia Sperry, MD        Allergies as of 02/23/2024 - Review Complete 02/23/2024  Allergen Reaction Noted   Prednisone  Other (See Comments) 06/06/2019    Vitals: BP 126/78 (BP Location: Left Arm, Patient Position: Sitting, Cuff Size: Normal)   Pulse 62   Ht 5' 3 (1.6 m)   Wt 160 lb 12.8 oz (72.9 kg)   BMI 28.48 kg/m      Physical exam:  General: The patient is awake, alert and appears not in acute  distress.  The patient is looking much older than her numeric age  Head: Normocephalic, atraumatic.  Neck is supple.  Neck circumference:15 Cardiovascular:  Regular rate and palpable peripheral pulse:  Respiratory: clear to auscultation.  Mallampati1-2, Skin:  Without evidence of edema, or rash, has Raynaud's.  Trunk:  slender   Neurologic exam : The patient is awake and alert, oriented to place and time.   Memory subjective  described as intact.  There is a normal attention span & concentration ability.  Speech is fluent without  dysarthria, dysphonia or aphasia.  Mood and affect are appropriate.  Cranial nerves: Pupils are equal and briskly reactive to light.  Funduscopic exam evidence of pallor or edema.  Extraocular movements  in vertical and horizontal planes intact and without nystagmus. Visual fields by finger perimetry are intact. Hearing to finger rub intact.  Facial sensation intact to fine touch. Facial motor strength is symmetric and tongue and uvula move midline.  Motor exam:   Normal tone and normal muscle bulk and symmetric normal strength in all extremities. Grip Strength weaker due to arthritis  Proximal strength of shoulder muscles and hip flexors was intact .  Sensory:  Fine touch and vibration were tested .    Coordination: Rapid alternating movements in the fingers/hands were normal.  Finger-to-nose maneuver was tested and showed no evidence of ataxia, dysmetria or tremor.  Gait and station: Patient walked with/ without assistive device .  Core Strength within normal limits.  Stance is stable and of  normal. Base.   Deep tendon reflexes: in the  upper and lower extremities are symmetric and  brisk without  Clonus.    Assessment: Total time for face to face interview and examination, for review of  images and laboratory testing, neurophysiology testing and pre-existing records, including out-of -network , was level 5.    Assessment is as follows  here:  1)   the patient seems to recall that she was not back to her full level of consciousness for at least 2 hours which would be an very unusual duration if we would presume a seizure at the onset.    I cannot rule out a seizure but I do think this may have had another vagal vagal or other origin.  And that see prolonged amnesia may have been a substance induced finding.    The patient had been on Wellbutrin partially to help with smoking cessation, Wellbutrin lowers the seizure threshold.  She had been drinking alcohol not excessively but still and that lowers the seizure threshold as well.    I am not sure that the [facility] had any effect on it, but I am sure that the benzodiazepine in the makes could have led to a prolonged clouding of her level of awareness.  Her BF also was reached by phone and confirmed that the patient needed about 60 minutes to come too- that's a long time he described her staring off_ no response to verbal stimuli.   Plan:  Treatment plan and additional workup planned after today includes:   Complete a seizure work up.   1) EEG  2) Brain MRI , with anti-anxiety medication 2 mg xanax.  Patient will travel with her sister- was asked to take 1 mg Xanax 30 minutes before  test, and then can take another 1 mg at test. .    RV in 6 weeks.     The patient's condition requires frequent monitoring and adjustments in the treatment plan, reflecting the ongoing complexity of care.  This provider is the continuing focal point for all needed services for this condition.   Dedra Gores, MD  [facility] Board certified by The [employer] of Sleep Medicine and Diplomate of the [employer] of Sleep Medicine. Board certified In Neurology through the ABPN, Fellow of the [employer] of Neurology.

## 2024-02-23 NOTE — Patient Instructions (Signed)
 Seizure, Adult A seizure is a sudden burst of abnormal activity in the brain. Seizures usually last from 30 seconds to 2 minutes. There are many types of seizures. And they can cause many different symptoms. What are the causes? Common causes of a seizure include: Fever or infection. Problems that affect the brain. These may include: A brain or head injury. A stroke. A brain tumor. Low levels of blood sugar or salt. Kidney problems or liver problems. Some inherited conditions. These are passed down from parent to child. Problems with a substance, such as: Having a reaction to a drug or a medicine. Stopping the use of a substance all of a sudden. When this causes problems, it's called withdrawal. Disorders that affect how you develop, such as autism spectrum disorder or cerebral palsy. Sometimes, the cause may not be known. Some people who have a seizure never have another one. A person who has repeated seizures over time without a clear cause has a condition called epilepsy. What increases the risk? Having a family history of epilepsy. Having had a tonic-clonic seizure before. This type of seizure causes: The muscles of the whole body to tighten, or contract. Loss of consciousness. Having a head injury or a stroke in the past. Having had too little oxygen at birth. What are the signs or symptoms? The symptoms vary depending on the type of seizure you have. Symptoms during a seizure Having convulsions. This means shaking with fast, jerky movements of muscles. Stiffness of the body. Breathing problems. Being confused. Staring or not responding to sound or touch. Head nodding, eye blinking, eye twitching, or fast eye movements. Drooling, grunting, or making clicking sounds with your mouth. Losing control of when you pee or poop. Symptoms before a seizure Feeling afraid, worried, or nervous. Feeling like you may vomit. Vertigo. This feels like: You are moving when you're  not. Things around you are moving when they're not. Dj vu. This is a feeling of having seen or heard something before. Odd tastes or smells. Changes in how you see. You may see flashing lights or spots. Symptoms after a seizure Being confused. Feeling sleepy. Headache. Sore muscles. How is this diagnosed? A seizure may be diagnosed based on: A description of your symptoms. Video of your seizures can be helpful. Your medical history. A physical exam. Tests, such as: Blood tests. CT scan. MRI. Electroencephalogram, or EEG. This test measures electrical activity in the brain. A test of your spinal fluid. This is called a spinal tap or lumbar puncture. How is this treated? If your seizure stops on its own, you will not need treatment. If your seizure lasts longer than 5 minutes, you'll normally need treatment. This may include: Medicines given through an IV. Avoiding things, such as medicines, that are known to cause your seizures. Medicines to prevent seizures. These are called antiepileptics. A device to prevent or control seizures. Eating foods that are low in carbohydrates and high in fat (ketogenic diet). Surgery. This is sometimes needed if you keep having seizures. Follow these instructions at home: Medicines Take your medicines only as told by your health care provider. Avoid anything that may keep your medicine from working, such as alcohol. Activity Follow your provider's advice about driving, swimming, and doing other things that would be dangerous if you had a seizure. Wait until your provider says it's safe for you to do these things. If you live in the [place] ask your local department of motor vehicles [facility]) when you can drive. Get  enough rest and sleep. Not getting enough sleep can make seizures more likely to happen. Teaching others  Teach friends and family what to do if you have a seizure. Tell them to: Help you get down to the ground safely. Protect your head  and body. Loosen any clothing around your neck. Turn you on your side. This helps keep your airway clear if you vomit. Know whether or not you need emergency care. Stay with you until you are better. Also, tell them what not to do if you have a seizure. Tell them: They should not hold you down. They should not put anything in your mouth. General instructions Avoid anything that has caused you to have seizures. Keep a seizure diary. Write down: What you remember about each seizure. What you think might have caused each seizure. Keep all follow-up visits. Your provider may need to monitor your progress. Contact a health care provider if: You have another seizure or seizures. Call each time you have a seizure. You have a change in how often or when you have seizures. You keep having seizures with treatment. You have symptoms of being sick or having an infection. You are not able to take your medicine. Get help right away if: You have or someone has seen you have: A seizure that lasts longer than 5 minutes. Many seizures in a row and you don't feel better between seizures. A seizure that makes it harder to breathe. A seizure that leaves you unable to speak or use a part of your body. You didn't wake up right away after a seizure. You injure yourself during a seizure. You have confusion or pain right after a seizure. These symptoms may be an emergency. Call 911 right away. Do not wait to see if the symptoms will go away. Do not drive yourself to the hospital. This information is not intended to replace advice given to you by your health care provider. Make sure you discuss any questions you have with your health care provider. Document Revised: 03/05/2023 Document Reviewed: 07/16/2022 Elsevier Patient Education  2024 [employer].     Driving restriction for 6 months.

## 2024-02-25 ENCOUNTER — Telehealth: Payer: Self-pay | Admitting: Neurology

## 2024-02-25 NOTE — Telephone Encounter (Signed)
 She told check out she wants an open MRI in [place] and she was going to call us  to let us  know where she had it done before.

## 2024-03-08 ENCOUNTER — Encounter: Payer: Self-pay | Admitting: Neurology

## 2024-03-21 ENCOUNTER — Ambulatory Visit: Admitting: Neurology

## 2024-03-21 DIAGNOSIS — R4182 Altered mental status, unspecified: Secondary | ICD-10-CM

## 2024-03-21 DIAGNOSIS — G44031 Episodic paroxysmal hemicrania, intractable: Secondary | ICD-10-CM

## 2024-03-21 DIAGNOSIS — G40909 Epilepsy, unspecified, not intractable, without status epilepticus: Secondary | ICD-10-CM

## 2024-03-21 DIAGNOSIS — R404 Transient alteration of awareness: Secondary | ICD-10-CM

## 2024-03-21 DIAGNOSIS — M542 Cervicalgia: Secondary | ICD-10-CM

## 2024-03-24 ENCOUNTER — Encounter: Payer: Self-pay | Admitting: Neurology

## 2024-03-31 ENCOUNTER — Ambulatory Visit: Payer: Self-pay | Admitting: Neurology

## 2024-03-31 DIAGNOSIS — G40909 Epilepsy, unspecified, not intractable, without status epilepticus: Secondary | ICD-10-CM | POA: Insufficient documentation

## 2024-03-31 NOTE — Procedures (Signed)
 [facility]  EEG (ELECTROENCEPHALOGRAM) REPORT  @patient  name    ORDERING CLINICIAN: Dedra Gores, M.D.  TECHNOLOGIST: , Burnard Suitor REEGT TECHNIQUE:  This EEG study was performed according to the 10-20 International system of electrode placement. Electrical activity was reviewed with band pass filter of 1-70Hz , sensitivity of 7 uV/mm, display speed of 69mm/sec with a 60Hz  notched filter applied as appropriate. EEG data were recorded continuously and digitally stored.    Recoding duration : 25 m 53 sec.  Activation included:  Photic stimulation ; yes  Hyperventilation ; Yes  .      Description: The EEG's posterior dominant background rhythm of 9  hertz was symmetrically displayed while the patient's eyes were closed and did promptly attenuate with eye opening. At baseline, the recording showed a high amplitude in symmetric fashion. Notable were intermittent sharp waves in both frontal areas without spike and wave formation. There was a high level of muscle tone related artefact noted.   Photic stimulation was initiated at frequencies from 3- through 21 hertz, resulting in minimal response ( photic entrainment ) without periodic or rhythmic discharges, or epileptiform activity.  Hyperventilation maneuver was initiated,   leading to amplitude build-up,   not leading to slowing.  The above mentioned bifrontal sharp waves were seen again, with higher amplitude but not leading to epileptiform discharges.  Following hyperventilation the patient's EEG was reviewed for a period of 1 and 2 minutes post maneuver, and indicated  relaxation/ drowsiness.   The patient was recorded while awake, while drowsy.   ECG: heart rate at 68-72 bpm in regular rhythm.   IMPRESSION:  This EEG is a normal EEG without epileptiform activity .    Dedra Gores, M.D. Accredited by the ABPN, ABSM.

## 2024-04-02 ENCOUNTER — Other Ambulatory Visit

## 2024-04-05 DIAGNOSIS — M1712 Unilateral primary osteoarthritis, left knee: Secondary | ICD-10-CM | POA: Diagnosis not present

## 2024-04-24 ENCOUNTER — Other Ambulatory Visit

## 2024-05-02 ENCOUNTER — Ambulatory Visit: Admitting: Neurology

## 2024-05-09 DIAGNOSIS — Z124 Encounter for screening for malignant neoplasm of cervix: Secondary | ICD-10-CM | POA: Diagnosis not present

## 2024-05-17 DIAGNOSIS — M461 Sacroiliitis, not elsewhere classified: Secondary | ICD-10-CM | POA: Diagnosis not present

## 2024-05-17 DIAGNOSIS — M25561 Pain in right knee: Secondary | ICD-10-CM | POA: Diagnosis not present

## 2024-05-17 DIAGNOSIS — M5416 Radiculopathy, lumbar region: Secondary | ICD-10-CM | POA: Diagnosis not present
